# Patient Record
Sex: Male | Born: 1960 | Race: White | Hispanic: No | Marital: Single | Smoking: Former smoker
Health system: Southern US, Community
[De-identification: ages and names within clinical notes are randomized; demographics above are authoritative.]

## PROBLEM LIST (undated history)

## (undated) DIAGNOSIS — I2510 Atherosclerotic heart disease of native coronary artery without angina pectoris: Secondary | ICD-10-CM

## (undated) DIAGNOSIS — F17200 Nicotine dependence, unspecified, uncomplicated: Secondary | ICD-10-CM

## (undated) DIAGNOSIS — R7303 Prediabetes: Secondary | ICD-10-CM

## (undated) DIAGNOSIS — E785 Hyperlipidemia, unspecified: Secondary | ICD-10-CM

## (undated) DIAGNOSIS — I2119 ST elevation (STEMI) myocardial infarction involving other coronary artery of inferior wall: Secondary | ICD-10-CM

## (undated) HISTORY — PX: TONSILLECTOMY: SUR1361

## (undated) HISTORY — PX: WRIST SURGERY: SHX841

---

## 2001-01-10 HISTORY — PX: WRIST SURGERY: SHX841

## 2001-10-16 ENCOUNTER — Ambulatory Visit (HOSPITAL_BASED_OUTPATIENT_CLINIC_OR_DEPARTMENT_OTHER): Admission: RE | Admit: 2001-10-16 | Discharge: 2001-10-17 | Payer: Self-pay | Admitting: Orthopedic Surgery

## 2010-06-04 ENCOUNTER — Emergency Department (HOSPITAL_COMMUNITY)
Admission: EM | Admit: 2010-06-04 | Discharge: 2010-06-04 | Disposition: A | Discharge status: Home / Self Care | Payer: 59 | Attending: Emergency Medicine | Admitting: Emergency Medicine

## 2010-06-04 DIAGNOSIS — E7800 Pure hypercholesterolemia, unspecified: Secondary | ICD-10-CM | POA: Insufficient documentation

## 2010-06-04 DIAGNOSIS — R42 Dizziness and giddiness: Secondary | ICD-10-CM | POA: Insufficient documentation

## 2010-06-04 DIAGNOSIS — R252 Cramp and spasm: Secondary | ICD-10-CM | POA: Insufficient documentation

## 2010-06-04 LAB — POCT I-STAT, CHEM 8
Calcium, Ion: 1.12 mmol/L (ref 1.12–1.32)
Creatinine, Ser: 1.1 mg/dL (ref 0.4–1.5)
Glucose, Bld: 72 mg/dL (ref 70–99)
Hemoglobin: 17 g/dL (ref 13.0–17.0)
TCO2: 26 mmol/L (ref 0–100)

## 2010-06-04 LAB — URINALYSIS, ROUTINE W REFLEX MICROSCOPIC
Bilirubin Urine: NEGATIVE
Glucose, UA: NEGATIVE mg/dL
Hgb urine dipstick: NEGATIVE
Protein, ur: NEGATIVE mg/dL
Urobilinogen, UA: 0.2 mg/dL (ref 0.0–1.0)

## 2016-10-06 ENCOUNTER — Encounter (HOSPITAL_COMMUNITY): Payer: Self-pay

## 2016-10-06 ENCOUNTER — Inpatient Hospital Stay (HOSPITAL_COMMUNITY)
Admission: EM | Admit: 2016-10-06 | Discharge: 2016-10-09 | DRG: 282 | Disposition: A | Discharge status: Home / Self Care | Payer: Commercial Managed Care - PPO | Attending: Cardiology | Admitting: Cardiology

## 2016-10-06 ENCOUNTER — Emergency Department (HOSPITAL_COMMUNITY): Payer: Commercial Managed Care - PPO

## 2016-10-06 DIAGNOSIS — I2729 Other secondary pulmonary hypertension: Secondary | ICD-10-CM

## 2016-10-06 DIAGNOSIS — F17210 Nicotine dependence, cigarettes, uncomplicated: Secondary | ICD-10-CM | POA: Diagnosis present

## 2016-10-06 DIAGNOSIS — E785 Hyperlipidemia, unspecified: Secondary | ICD-10-CM | POA: Diagnosis present

## 2016-10-06 DIAGNOSIS — Z8249 Family history of ischemic heart disease and other diseases of the circulatory system: Secondary | ICD-10-CM

## 2016-10-06 DIAGNOSIS — I2510 Atherosclerotic heart disease of native coronary artery without angina pectoris: Secondary | ICD-10-CM | POA: Diagnosis present

## 2016-10-06 DIAGNOSIS — I25110 Atherosclerotic heart disease of native coronary artery with unstable angina pectoris: Secondary | ICD-10-CM | POA: Diagnosis not present

## 2016-10-06 DIAGNOSIS — I447 Left bundle-branch block, unspecified: Secondary | ICD-10-CM | POA: Diagnosis present

## 2016-10-06 DIAGNOSIS — R079 Chest pain, unspecified: Secondary | ICD-10-CM | POA: Diagnosis present

## 2016-10-06 DIAGNOSIS — Z720 Tobacco use: Secondary | ICD-10-CM

## 2016-10-06 DIAGNOSIS — I2119 ST elevation (STEMI) myocardial infarction involving other coronary artery of inferior wall: Principal | ICD-10-CM | POA: Diagnosis present

## 2016-10-06 DIAGNOSIS — Z833 Family history of diabetes mellitus: Secondary | ICD-10-CM | POA: Diagnosis not present

## 2016-10-06 DIAGNOSIS — I259 Chronic ischemic heart disease, unspecified: Secondary | ICD-10-CM

## 2016-10-06 HISTORY — DX: ST elevation (STEMI) myocardial infarction involving other coronary artery of inferior wall: I21.19

## 2016-10-06 HISTORY — DX: Nicotine dependence, unspecified, uncomplicated: F17.200

## 2016-10-06 HISTORY — DX: Hyperlipidemia, unspecified: E78.5

## 2016-10-06 LAB — BASIC METABOLIC PANEL
Anion gap: 10 (ref 5–15)
BUN: 5 mg/dL — AB (ref 6–20)
CHLORIDE: 104 mmol/L (ref 101–111)
CO2: 21 mmol/L — ABNORMAL LOW (ref 22–32)
Calcium: 9.3 mg/dL (ref 8.9–10.3)
Creatinine, Ser: 0.92 mg/dL (ref 0.61–1.24)
GFR calc Af Amer: >60 mL/min (ref >60)
GFR calc non Af Amer: >60 mL/min (ref >60)
Glucose, Bld: 124 mg/dL — ABNORMAL HIGH (ref 65–99)
POTASSIUM: 4 mmol/L (ref 3.5–5.1)
SODIUM: 135 mmol/L (ref 135–145)

## 2016-10-06 LAB — CBC
HEMATOCRIT: 47.6 % (ref 39.0–52.0)
Hemoglobin: 16.3 g/dL (ref 13.0–17.0)
MCH: 33.5 pg (ref 26.0–34.0)
MCHC: 34.2 g/dL (ref 30.0–36.0)
MCV: 97.9 fL (ref 78.0–100.0)
Platelets: 232 10*3/uL (ref 150–400)
RBC: 4.86 MIL/uL (ref 4.22–5.81)
RDW: 12.3 % (ref 11.5–15.5)
WBC: 15.1 10*3/uL — AB (ref 4.0–10.5)

## 2016-10-06 LAB — I-STAT TROPONIN, ED: Troponin i, poc: 25.38 ng/mL (ref 0.00–0.08)

## 2016-10-06 MED ORDER — METOPROLOL TARTRATE 12.5 MG HALF TABLET
12.5000 mg | ORAL_TABLET | Freq: Two times a day (BID) | ORAL | Status: DC
  Administered 2016-10-06 – 2016-10-07 (×2): 12.5 mg via ORAL
  Filled 2016-10-06: qty 1

## 2016-10-06 MED ORDER — HEPARIN BOLUS VIA INFUSION
4000.0000 [IU] | Freq: Once | INTRAVENOUS | Status: AC
  Administered 2016-10-06: 4000 [IU] via INTRAVENOUS
  Filled 2016-10-06: qty 4000

## 2016-10-06 MED ORDER — HEPARIN (PORCINE) IN NACL 100-0.45 UNIT/ML-% IJ SOLN
1150.0000 [IU]/h | INTRAMUSCULAR | Status: DC
  Administered 2016-10-06: 1150 [IU]/h via INTRAVENOUS
  Filled 2016-10-06: qty 250

## 2016-10-06 MED ORDER — VARENICLINE TARTRATE 1 MG PO TABS
1.0000 mg | ORAL_TABLET | Freq: Two times a day (BID) | ORAL | Status: DC
  Administered 2016-10-07 – 2016-10-08 (×4): 1 mg via ORAL
  Filled 2016-10-06 (×6): qty 1

## 2016-10-06 MED ORDER — ASPIRIN 81 MG PO CHEW
324.0000 mg | CHEWABLE_TABLET | ORAL | Status: AC
  Administered 2016-10-06: 324 mg via ORAL
  Filled 2016-10-06: qty 4

## 2016-10-06 MED ORDER — NITROGLYCERIN 0.4 MG SL SUBL: 0.4000 mg | SUBLINGUAL_TABLET | SUBLINGUAL | Status: DC | PRN

## 2016-10-06 MED ORDER — ASPIRIN 300 MG RE SUPP: 300.0000 mg | RECTAL | Status: AC

## 2016-10-06 NOTE — ED Notes (Signed)
Dr Corlis Leak, CN Lequita Halt and NF [facility] informed Troponin 25.38

## 2016-10-06 NOTE — ED Provider Notes (Signed)
[facility]-EMERGENCY DEPT Provider Note   CSN: [id number] Arrival date & time: 10/06/16  1914     History   Chief Complaint Chief Complaint  Patient presents with  . Chest Pain    HPI Raymond Bowers is a 56 y.o. male.  HPI   Pt is a 56 yo presenting with chest pain.  Patient has history of high cholesterol and family history of CAD. Patient reports that at 1 AM he woke with stabbing chest pain. He felt stabbing between his shoulder blades. He reports having hard time feeling energized, global fatigue since then.heaviness to bilateral arms.   History reviewed. No pertinent past medical history.  There are no active problems to display for this patient.   History reviewed. No pertinent surgical history.     Home Medications    Prior to Admission medications   Not on File    Family History No family history on file.  Social History Social History  Substance Use Topics  . Smoking status: Current Some Day Smoker  . Smokeless tobacco: Never Used  . Alcohol use No     Allergies   Patient has no known allergies.   Review of Systems Review of Systems  Constitutional: Negative for activity change.  Respiratory: Positive for chest tightness. Negative for shortness of breath.   Cardiovascular: Positive for chest pain.  Gastrointestinal: Negative for abdominal pain.     Physical Exam Updated Vital Signs BP (!) 158/91   Pulse 74   Temp 98 F (36.7 C)   Resp 15   SpO2 98%   Physical Exam  Constitutional: He is oriented to person, place, and time. He appears well-nourished.  HENT:  Head: Normocephalic.  Eyes: Conjunctivae are normal.  Cardiovascular: Normal rate and regular rhythm.   Pulmonary/Chest: Effort normal and breath sounds normal.  Abdominal: Soft. He exhibits no distension. There is no tenderness.  Neurological: He is oriented to person, place, and time.  Skin: Skin is warm and dry. He is not diaphoretic.  Psychiatric: He has a normal mood and  affect. His behavior is normal.     ED Treatments / Results  Labs (all labs ordered are listed, but only abnormal results are displayed) Labs Reviewed  BASIC METABOLIC PANEL - Abnormal; Notable for the following:       Result Value   CO2 21 (*)    Glucose, Bld 124 (*)    BUN 5 (*)    All other components within normal limits  CBC - Abnormal; Notable for the following:    WBC 15.1 (*)    All other components within normal limits  I-STAT TROPONIN, ED - Abnormal; Notable for the following:    Troponin i, poc 25.38 (*)    All other components within normal limits    EKG  EKG Interpretation  Date/Time:  Thursday October 06 2016 19:32:08 EDT Ventricular Rate:  71 PR Interval:  126 QRS Duration: 94 QT Interval:  394 QTC Calculation: 428 R Axis:   -15 Text Interpretation:  Sinus rhythm with sinus arrhythmia with frequent Premature ventricular complexes Inferior infarct , age undetermined Possible Anterior infarct , age undetermined Abnormal ECG bundle at 3, avf, not dynamically changing, pt has no CP.  Confirmed by Bary Castilla ([id number]) on 10/06/2016 8:13:53 PM       Radiology No results found.  Procedures Procedures (including critical care time) CRITICAL CARE Performed by: Arlana Hove Total critical care time: 60 minutes Critical care time was exclusive of separately billable procedures  and treating other patients. Critical care was necessary to treat or prevent imminent or life-threatening deterioration. Critical care was time spent personally by me on the following activities: development of treatment plan with patient and/or surrogate as well as nursing, discussions with consultants, evaluation of patient's response to treatment, examination of patient, obtaining history from patient or surrogate, ordering and performing treatments and interventions, ordering and review of laboratory studies, ordering and review of radiographic studies, pulse oximetry and  re-evaluation of patient's condition.  Medications Ordered in ED Medications - No data to display   Initial Impression / Assessment and Plan / ED Course  I have reviewed the triage vital signs and the nursing notes.  Pertinent labs & imaging results that were available during my care of the patient were reviewed by me and considered in my medical decision making (see chart for details).     Very well-appearing 56 year old male presenting with chest pain that started last night at 1 AM. He now only feels a mild indigestion. Patient's troponin is 25. EKG shows narrow complex with junctional beats with left bundle branch block.  8:45 PM Called Dr. Allyson Sabal on for STEMI pager to discuss EKG findings. He recommends admission to cardiology with heparin.    CRITICAL CARE Performed by: Arlana Hove Total critical care time: 60 minutes Critical care time was exclusive of separately billable procedures and treating other patients. Critical care was necessary to treat or prevent imminent or life-threatening deterioration. Critical care was time spent personally by me on the following activities: development of treatment plan with patient and/or surrogate as well as nursing, discussions with consultants, evaluation of patient's response to treatment, examination of patient, obtaining history from patient or surrogate, ordering and performing treatments and interventions, ordering and review of laboratory studies, ordering and review of radiographic studies, pulse oximetry and re-evaluation of patient's condition.  Final Clinical Impressions(s) / ED Diagnoses   Final diagnoses:  None    New Prescriptions New Prescriptions   No medications on file     Raymond Derrick, MD 10/10/16 [phone]

## 2016-10-06 NOTE — ED Notes (Signed)
Cardiologist at bedside.  

## 2016-10-06 NOTE — H&P (Signed)
CARDIOLOGY ADMISSION NOTE  Patient ID: Raymond Bowers MRN: [id number] DOB/AGE: 06-27-1960 56 y.o.  Admit date: 10/06/2016 Primary Physician   Dr. Jeannetta Nap Primary Cardiologist   New  Chief Complaint    Chest pain  HPI:   The patient has no past cardiac history.  He reports that at 1 am he developed chest pain.  This was severe, mid upper pain.  He has not had this before.  He thought that it was reflux.  It radiated to his back and shoulders.  His arms hurt.  He walked around his house and took ASA.  The pain eased about two hours later.  He had some residual arm and chest soreness but was able to go to work.  He felt like he had some residual soreness so when he got home from work he came to the ED.  He says that he is currently pain free.  His initial troponin was greater than 25.  EKG demonstrates inferior Q waves with mild 1 mm ST elevation lead II with inverting T waves.  He has intermittent LBBB.    The patient otherwise as been well.  The patient denies any new symptoms such as chest discomfort, neck or arm discomfort. There has been no new shortness of breath, PND or orthopnea. There have been no reported palpitations, presyncope or syncope.  He is active at home.    Past Medical History:  Diagnosis Date  . Dyslipidemia     Past Surgical History:  Procedure Laterality Date  . TONSILLECTOMY    . WRIST SURGERY      No Known Allergies No current facility-administered medications on file prior to encounter.    No current outpatient prescriptions on file prior to encounter.   Social History   Social History  . Marital status: Single    Spouse name: N/A  . Number of children: 1  . Years of education: N/A   Occupational History  . Not on file.   Social History Main Topics  . Smoking status: Current Some Day Smoker    Packs/day: 1.00    Years: 25.00    Types: Cigarettes  . Smokeless tobacco: Never Used  . Alcohol use No  . Drug use: Unknown  . Sexual activity: Not on  file   Other Topics Concern  . Not on file   Social History Narrative   Lives at home with son.  Geophysical data processor.      Family History  Problem Relation Age of Onset  . Heart attack Mother 55  . Peripheral vascular disease Brother   . Diabetes Brother      ROS:  As stated in the HPI and negative for all other systems.  Physical Exam: Blood pressure (!) 125/93, pulse 75, temperature 98 F (36.7 C), resp. rate 16, height  (1.778 m), weight 190 lb (86.2 kg), SpO2 98 %.  GENERAL:  Well appearing HEENT:  Pupils equal round and reactive, fundi not visualized, oral mucosa unremarkable NECK:  No jugular venous distention, waveform within normal limits, carotid upstroke brisk and symmetric, no bruits, no thyromegaly LYMPHATICS:  No cervical, inguinal adenopathy LUNGS:  Clear to auscultation bilaterally BACK:  No CVA tenderness CHEST:  Unremarkable HEART:  PMI not displaced or sustained,S1 and S2 within normal limits, no S3, no S4, no clicks, no rubs, no murmurs ABD:  Flat, positive bowel sounds normal in frequency in pitch, no bruits, no rebound, no guarding, no midline pulsatile mass, no hepatomegaly, no splenomegaly EXT:  2 plus pulses throughout, no edema, no cyanosis no clubbing SKIN:  No rashes no nodules NEURO:  Cranial nerves II through XII grossly intact, motor grossly intact throughout PSYCH:  Cognitively intact, oriented to person place and time  Labs: Lab Results  Component Value Date   BUN 5 (L) 10/06/2016   Lab Results  Component Value Date   CREATININE 0.92 10/06/2016   Lab Results  Component Value Date   NA 135 10/06/2016   K 4.0 10/06/2016   CL 104 10/06/2016   CO2 21 (L) 10/06/2016   No results found for: TROPONINI Lab Results  Component Value Date   WBC 15.1 (H) 10/06/2016   HGB 16.3 10/06/2016   HCT 47.6 10/06/2016   MCV 97.9 10/06/2016   PLT 232 10/06/2016   No results found for: CHOL, HDL, LDLCALC, LDLDIRECT, TRIG, CHOLHDL No results  found for: ALT, AST, GGT, ALKPHOS, BILITOT    Radiology:  CXR:  Heart size within normal limits. No acute consolidation or effusion. Possible small calcified nodules in the right upper lobe. No pneumothorax.  EKG:  NSR, rate 70, axis WNL, intervals WNL, inferior ST elevation of 1mm lead II and III.  Inferior Q waves with mild inferior T wave inversion. Intermittent LBBB.    ASSESSMENT AND PLAN:    INFERIOR MI:  Late presentation of an MI (20 hours) without ongoing pain.   No indication for urgent cath at this point.  Admit for heparin, ASA, beta blocker.  Cath in AM.  The patient understands that risks included but are not limited to stroke (1 in 1000), death (1 in 1000), kidney failure [usually temporary] (1 in 500), bleeding (1 in 200), allergic reaction [possibly serious] (1 in 200).  The patient understands and agrees to proceed.   TOBACCO:  Continue Chantix  DYSLIPIDEMIA:  Increase Lipitor to 80 mg daily.    SignedRollene Rotunda 10/06/2016, 9:43 PM

## 2016-10-06 NOTE — ED Triage Notes (Signed)
Pt states that he woke up last night with CP around 1am, central, radiation to both arm and shoulders, denies other cardiac symptoms, pain has subsided but he still feels a burning like indigestion.

## 2016-10-06 NOTE — ED Notes (Signed)
[name] would like to be contacted when moved to a room, number in the chart

## 2016-10-06 NOTE — ED Notes (Signed)
Pain lasted about an hour and a half. Pain started this morning around 1am, sharp central chest pain that woke him from his sleep.

## 2016-10-06 NOTE — ED Notes (Signed)
Pt provided with turkey sandwich and diet coke 

## 2016-10-06 NOTE — Progress Notes (Signed)
ANTICOAGULATION CONSULT NOTE - Initial Consult  Pharmacy Consult for heparin Indication: chest pain/ACS  No Known Allergies  Patient Measurements: Height:  (177.8 cm) Weight: 190 lb (86.2 kg) IBW/kg (Calculated) : 73 Heparin Dosing Weight: 86.2 kg  Vital Signs: Temp: 98 F (36.7 C) (09/27 1928) BP: 158/91 (09/27 2015) Pulse Rate: 74 (09/27 2015)  Labs:  Recent Labs  10/06/16 1928  HGB 16.3  HCT 47.6  PLT 232  CREATININE 0.92     Medical History: History reviewed. No pertinent past medical history.   Assessment: 56 yo male with chest pain with radiation to both arms and shoulders. Troponin 25. Starting heparin gtt for NSTEMI. CBC wnl.   Goal of Therapy:  Heparin level 0.3-0.7 units/ml Monitor platelets by anticoagulation protocol: Yes    Plan:  -Heparin bolus 4000 units x1 then 1150 units/hr -Daily HL, CBC -First level with AM labs -F/u cards plans   Baldemar Friday 10/06/2016,8:51 PM

## 2016-10-07 ENCOUNTER — Inpatient Hospital Stay (HOSPITAL_COMMUNITY)
Admission: EM | Disposition: A | Discharge status: Home / Self Care | Payer: Self-pay | Source: Home / Self Care | Attending: Cardiology

## 2016-10-07 ENCOUNTER — Encounter (HOSPITAL_COMMUNITY): Payer: Self-pay

## 2016-10-07 DIAGNOSIS — Z720 Tobacco use: Secondary | ICD-10-CM

## 2016-10-07 DIAGNOSIS — I2729 Other secondary pulmonary hypertension: Secondary | ICD-10-CM

## 2016-10-07 DIAGNOSIS — E785 Hyperlipidemia, unspecified: Secondary | ICD-10-CM

## 2016-10-07 DIAGNOSIS — I2119 ST elevation (STEMI) myocardial infarction involving other coronary artery of inferior wall: Secondary | ICD-10-CM

## 2016-10-07 DIAGNOSIS — I25110 Atherosclerotic heart disease of native coronary artery with unstable angina pectoris: Secondary | ICD-10-CM

## 2016-10-07 HISTORY — PX: ULTRASOUND GUIDANCE FOR VASCULAR ACCESS: SHX6516

## 2016-10-07 HISTORY — PX: LEFT HEART CATH AND CORONARY ANGIOGRAPHY: CATH118249

## 2016-10-07 HISTORY — DX: ST elevation (STEMI) myocardial infarction involving other coronary artery of inferior wall: I21.19

## 2016-10-07 LAB — BASIC METABOLIC PANEL
Anion gap: 7 (ref 5–15)
BUN: 6 mg/dL (ref 6–20)
CHLORIDE: 104 mmol/L (ref 101–111)
CO2: 22 mmol/L (ref 22–32)
CREATININE: 0.81 mg/dL (ref 0.61–1.24)
Calcium: 8.5 mg/dL — ABNORMAL LOW (ref 8.9–10.3)
GFR calc Af Amer: >60 mL/min (ref >60)
GFR calc non Af Amer: >60 mL/min (ref >60)
GLUCOSE: 125 mg/dL — AB (ref 65–99)
POTASSIUM: 3.9 mmol/L (ref 3.5–5.1)
SODIUM: 133 mmol/L — AB (ref 135–145)

## 2016-10-07 LAB — LIPID PANEL
CHOL/HDL RATIO: 3 ratio
Cholesterol: 151 mg/dL (ref 0–200)
HDL: 50 mg/dL (ref >40)
LDL CALC: 85 mg/dL (ref 0–99)
TRIGLYCERIDES: 81 mg/dL (ref <150)
VLDL: 16 mg/dL (ref 0–40)

## 2016-10-07 LAB — PROTIME-INR
INR: 1.18
PROTHROMBIN TIME: 15 s (ref 11.4–15.2)

## 2016-10-07 LAB — CBC
HCT: 43.8 % (ref 39.0–52.0)
HCT: 45 % (ref 39.0–52.0)
HEMOGLOBIN: 15 g/dL (ref 13.0–17.0)
Hemoglobin: 15.7 g/dL (ref 13.0–17.0)
MCH: 33.9 pg (ref 26.0–34.0)
MCH: 34.4 pg — ABNORMAL HIGH (ref 26.0–34.0)
MCHC: 34.2 g/dL (ref 30.0–36.0)
MCHC: 34.9 g/dL (ref 30.0–36.0)
MCV: 98.9 fL (ref 78.0–100.0)
MCV: 98.7 fL (ref 78.0–100.0)
PLATELETS: 167 10*3/uL (ref 150–400)
Platelets: 186 10*3/uL (ref 150–400)
RBC: 4.43 MIL/uL (ref 4.22–5.81)
RBC: 4.56 MIL/uL (ref 4.22–5.81)
RDW: 12.4 % (ref 11.5–15.5)
RDW: 12.6 % (ref 11.5–15.5)
WBC: 12.2 10*3/uL — ABNORMAL HIGH (ref 4.0–10.5)
WBC: 13.5 10*3/uL — AB (ref 4.0–10.5)

## 2016-10-07 LAB — HEPARIN LEVEL (UNFRACTIONATED): HEPARIN UNFRACTIONATED: 0.39 [IU]/mL (ref 0.30–0.70)

## 2016-10-07 LAB — CREATININE, SERUM: CREATININE: 0.87 mg/dL (ref 0.61–1.24)

## 2016-10-07 LAB — TROPONIN I
TROPONIN I: 24.45 ng/mL — AB (ref <0.03)
Troponin I: 36.33 ng/mL (ref <0.03)
Troponin I: 30.95 ng/mL (ref <0.03)

## 2016-10-07 LAB — HIV ANTIBODY (ROUTINE TESTING W REFLEX): HIV Screen 4th Generation wRfx: NONREACTIVE

## 2016-10-07 LAB — TSH: TSH: 1.18 u[IU]/mL (ref 0.350–4.500)

## 2016-10-07 SURGERY — LEFT HEART CATH AND CORONARY ANGIOGRAPHY
Anesthesia: LOCAL

## 2016-10-07 MED ORDER — CLOPIDOGREL BISULFATE 75 MG PO TABS
75.0000 mg | ORAL_TABLET | Freq: Every day | ORAL | Status: DC
  Administered 2016-10-08: 75 mg via ORAL
  Filled 2016-10-07 (×2): qty 1

## 2016-10-07 MED ORDER — SODIUM CHLORIDE 0.9 % WEIGHT BASED INFUSION: 1.0000 mL/kg/h | INTRAVENOUS | Status: DC

## 2016-10-07 MED ORDER — ACETAMINOPHEN 325 MG PO TABS: 650.0000 mg | ORAL_TABLET | ORAL | Status: DC | PRN

## 2016-10-07 MED ORDER — ONDANSETRON HCL 4 MG/2ML IJ SOLN: 4.0000 mg | Freq: Four times a day (QID) | INTRAMUSCULAR | Status: DC | PRN

## 2016-10-07 MED ORDER — ASPIRIN EC 81 MG PO TBEC
81.0000 mg | DELAYED_RELEASE_TABLET | Freq: Every day | ORAL | Status: DC
  Administered 2016-10-07: 81 mg via ORAL
  Filled 2016-10-07: qty 1

## 2016-10-07 MED ORDER — SODIUM CHLORIDE 0.9 % IV SOLN: 250.0000 mL | INTRAVENOUS | Status: DC | PRN

## 2016-10-07 MED ORDER — VERAPAMIL HCL 2.5 MG/ML IV SOLN
INTRAVENOUS | Status: AC
  Filled 2016-10-07: qty 2

## 2016-10-07 MED ORDER — SODIUM CHLORIDE 0.9 % WEIGHT BASED INFUSION
3.0000 mL/kg/h | INTRAVENOUS | Status: DC
  Administered 2016-10-07: 3 mL/kg/h via INTRAVENOUS

## 2016-10-07 MED ORDER — HEPARIN SODIUM (PORCINE) 5000 UNIT/ML IJ SOLN
5000.0000 [IU] | Freq: Three times a day (TID) | INTRAMUSCULAR | Status: DC
  Administered 2016-10-07 – 2016-10-09 (×5): 5000 [IU] via SUBCUTANEOUS
  Filled 2016-10-07 (×5): qty 1

## 2016-10-07 MED ORDER — MIDAZOLAM HCL 2 MG/2ML IJ SOLN
INTRAMUSCULAR | Status: AC
  Filled 2016-10-07: qty 2

## 2016-10-07 MED ORDER — ASPIRIN 81 MG PO CHEW
81.0000 mg | CHEWABLE_TABLET | ORAL | Status: AC
  Administered 2016-10-07: 81 mg via ORAL
  Filled 2016-10-07: qty 1

## 2016-10-07 MED ORDER — HEPARIN (PORCINE) IN NACL 2-0.9 UNIT/ML-% IJ SOLN
INTRAMUSCULAR | Status: AC
  Filled 2016-10-07: qty 1000

## 2016-10-07 MED ORDER — SODIUM CHLORIDE 0.9% FLUSH
3.0000 mL | Freq: Two times a day (BID) | INTRAVENOUS | Status: DC
  Administered 2016-10-07 – 2016-10-08 (×3): 3 mL via INTRAVENOUS

## 2016-10-07 MED ORDER — ATORVASTATIN CALCIUM 80 MG PO TABS: 80.0000 mg | ORAL_TABLET | Freq: Every day | ORAL | Status: DC

## 2016-10-07 MED ORDER — SODIUM CHLORIDE 0.9% FLUSH: 3.0000 mL | INTRAVENOUS | Status: DC | PRN

## 2016-10-07 MED ORDER — OXYCODONE HCL 5 MG PO TABS: 5.0000 mg | ORAL_TABLET | ORAL | Status: DC | PRN

## 2016-10-07 MED ORDER — HEPARIN (PORCINE) IN NACL 2-0.9 UNIT/ML-% IJ SOLN
INTRAMUSCULAR | Status: AC | PRN
  Administered 2016-10-07: 1000 mL

## 2016-10-07 MED ORDER — SODIUM CHLORIDE 0.9% FLUSH: 3.0000 mL | Freq: Two times a day (BID) | INTRAVENOUS | Status: DC

## 2016-10-07 MED ORDER — SODIUM CHLORIDE 0.9 % IV SOLN
INTRAVENOUS | Status: AC
  Administered 2016-10-07: 12:00:00 via INTRAVENOUS

## 2016-10-07 MED ORDER — IOPAMIDOL (ISOVUE-370) INJECTION 76%
INTRAVENOUS | Status: DC | PRN
  Administered 2016-10-07: 110 mL via INTRAVENOUS

## 2016-10-07 MED ORDER — FENTANYL CITRATE (PF) 100 MCG/2ML IJ SOLN
INTRAMUSCULAR | Status: DC | PRN
  Administered 2016-10-07: 50 ug via INTRAVENOUS

## 2016-10-07 MED ORDER — IOPAMIDOL (ISOVUE-370) INJECTION 76%
INTRAVENOUS | Status: AC
  Filled 2016-10-07: qty 100

## 2016-10-07 MED ORDER — MIDAZOLAM HCL 2 MG/2ML IJ SOLN
INTRAMUSCULAR | Status: DC | PRN
  Administered 2016-10-07 (×2): 1 mg via INTRAVENOUS

## 2016-10-07 MED ORDER — ATORVASTATIN CALCIUM 80 MG PO TABS
80.0000 mg | ORAL_TABLET | Freq: Every day | ORAL | Status: DC
  Administered 2016-10-07 – 2016-10-08 (×2): 80 mg via ORAL
  Filled 2016-10-07 (×2): qty 1

## 2016-10-07 MED ORDER — HEPARIN SODIUM (PORCINE) 1000 UNIT/ML IJ SOLN
INTRAMUSCULAR | Status: AC
  Filled 2016-10-07: qty 1

## 2016-10-07 MED ORDER — HEPARIN SODIUM (PORCINE) 1000 UNIT/ML IJ SOLN
INTRAMUSCULAR | Status: DC | PRN
  Administered 2016-10-07: 5000 [IU] via INTRAVENOUS

## 2016-10-07 MED ORDER — VERAPAMIL HCL 2.5 MG/ML IV SOLN
INTRAVENOUS | Status: DC | PRN
  Administered 2016-10-07: 10 mL via INTRA_ARTERIAL

## 2016-10-07 MED ORDER — LIDOCAINE HCL (PF) 1 % IJ SOLN
INTRAMUSCULAR | Status: DC | PRN
  Administered 2016-10-07: 2 mL

## 2016-10-07 MED ORDER — LIDOCAINE HCL 2 % IJ SOLN
INTRAMUSCULAR | Status: AC
  Filled 2016-10-07: qty 10

## 2016-10-07 MED ORDER — ASPIRIN 81 MG PO CHEW
81.0000 mg | CHEWABLE_TABLET | Freq: Every day | ORAL | Status: DC
  Administered 2016-10-08: 81 mg via ORAL
  Filled 2016-10-07 (×2): qty 1

## 2016-10-07 MED ORDER — METOPROLOL TARTRATE 25 MG PO TABS
25.0000 mg | ORAL_TABLET | Freq: Two times a day (BID) | ORAL | Status: DC
  Administered 2016-10-07 – 2016-10-08 (×3): 25 mg via ORAL
  Filled 2016-10-07 (×3): qty 1

## 2016-10-07 MED ORDER — FENTANYL CITRATE (PF) 100 MCG/2ML IJ SOLN
INTRAMUSCULAR | Status: AC
  Filled 2016-10-07: qty 2

## 2016-10-07 SURGICAL SUPPLY — 17 items
CATH 5FR JL3.5 JR4 ANG PIG MP (CATHETERS) ×3 IMPLANT
CATH LAUNCHER 5F EBU3.0 (CATHETERS) ×2 IMPLANT
CATHETER LAUNCHER 5F EBU3.0 (CATHETERS) ×3
COVER PRB 48X5XTLSCP FOLD TPE (BAG) ×2 IMPLANT
COVER PROBE 5X48 (BAG) ×1
DEVICE RAD COMP TR BAND LRG (VASCULAR PRODUCTS) ×3 IMPLANT
GLIDESHEATH SLEND A-KIT 6F 22G (SHEATH) ×3 IMPLANT
GUIDEWIRE INQWIRE 1.5J.035X260 (WIRE) ×2 IMPLANT
INQWIRE 1.5J .035X260CM (WIRE) ×3
KIT HEART LEFT (KITS) ×3 IMPLANT
PACK CARDIAC CATHETERIZATION (CUSTOM PROCEDURE TRAY) ×3 IMPLANT
PROTECTION STATION PRESSURIZED (MISCELLANEOUS) ×3
STATION PROTECTION PRESSURIZED (MISCELLANEOUS) ×2 IMPLANT
TRANSDUCER W/STOPCOCK (MISCELLANEOUS) ×3 IMPLANT
TUBING ART PRESS 72  MALE/FEM (TUBING) ×1
TUBING ART PRESS 72 MALE/FEM (TUBING) ×2 IMPLANT
TUBING CIL FLEX 10 FLL-RA (TUBING) ×3 IMPLANT

## 2016-10-07 NOTE — Progress Notes (Signed)
Progress Note  Patient Name: Raymond Bowers Date of Encounter: 10/07/2016  Primary Cardiologist: Hochrein  Subjective   No current chest discomfort. He presented with late inferior infarct. Has hyperlipidemia, smokes and  family history of CAD.  Inpatient Medications    Scheduled Meds: . aspirin EC  81 mg Oral Daily  . atorvastatin  80 mg Oral q1800  . metoprolol tartrate  12.5 mg Oral BID  . sodium chloride flush  3 mL Intravenous Q12H  . varenicline  1 mg Oral BID WC   Continuous Infusions: . sodium chloride    . sodium chloride 1 mL/kg/hr (10/07/16 0726)  . heparin 1,150 Units/hr (10/06/16 2111)   PRN Meds: sodium chloride, acetaminophen, nitroGLYCERIN, ondansetron (ZOFRAN) IV, sodium chloride flush   Vital Signs    Vitals:   10/06/16 2300 10/07/16 0050 10/07/16 0415 10/07/16 0830  BP: (!) 153/95 (!) 150/92 130/80 128/82  Pulse: 65 (!) 58  70  Resp: 13 16 (!) 24 20  Temp:  99.3 F (37.4 C) 99.1 F (37.3 C) 98.9 F (37.2 C)  TempSrc:  Oral Oral Oral  SpO2: 97% 96%  98%  Weight:  186 lb 12.8 oz (84.7 kg)    Height:   (1.778 m)      Intake/Output Summary (Last 24 hours) at 10/07/16 0911 Last data filed at 10/07/16 0727  Gross per 24 hour  Intake           557.43 ml  Output              250 ml  Net           307.43 ml   Filed Weights   10/06/16 2015 10/07/16 0050  Weight: 190 lb (86.2 kg) 186 lb 12.8 oz (84.7 kg)    Telemetry    Sinus rhythm with occasional PVCs. - Personally Reviewed  ECG    Sinus rhythm with inferior infarct, recent - Personally Reviewed  Physical Exam  Young appearing 56 year old GEN: No acute distress.   Neck: No JVD Cardiac: RRR, no murmurs, rubs, or gallops.  Respiratory: Clear to auscultation bilaterally. GI: Soft, nontender, non-distended  MS: No edema; No deformity. Neuro:  Nonfocal  Psych: Normal affect   Labs    Chemistry Recent Labs Lab 10/06/16 1928 10/07/16 0800  NA 135 133*  K 4.0 3.9  CL 104 104   CO2 21* 22  GLUCOSE 124* 125*  BUN 5* 6  CREATININE 0.92 0.81  CALCIUM 9.3 8.5*  GFRNONAA >60 >60  GFRAA >60 >60  ANIONGAP 10 7     Hematology Recent Labs Lab 10/06/16 1928 10/07/16 0800  WBC 15.1* 12.2*  RBC 4.86 4.43  HGB 16.3 15.0  HCT 47.6 43.8  MCV 97.9 98.9  MCH 33.5 33.9  MCHC 34.2 34.2  RDW 12.3 12.4  PLT 232 186    Cardiac Enzymes Recent Labs Lab 10/07/16 0112 10/07/16 0800  TROPONINI 36.33* 30.95*    Recent Labs Lab 10/06/16 1948  TROPIPOC 25.38*     BNPNo results for input(s): BNP, PROBNP in the last 168 hours.   DDimer No results for input(s): DDIMER in the last 168 hours.   Radiology    Dg Chest Portable 1 View  Result Date: 10/06/2016 CLINICAL DATA:  Chest pain EXAM: PORTABLE CHEST 1 VIEW COMPARISON:  None. FINDINGS: Heart size within normal limits. No acute consolidation or effusion. Possible small calcified nodules in the right upper lobe. No pneumothorax. IMPRESSION: No active disease. Electronically Signed  By: Jasmine Pang M.D.   On: 10/06/2016 20:47    Cardiac Studies   No vascular studies  Patient Profile     56 y.o. male with multiple risk factors including smoking, hyperlipidemia on statin therapy, and family history. The patient presents 18-24 hours after onset of symptoms and has evidence of Q-wave inferior infarct on EKG.  Assessment & Plan    1. Probable completed inferior infarction starting 18-24 hours prior to presentation. Significant elevation in markers and Q waves noted on EKG. 2. Hyperlipidemia, target needs to be LDL less than 70 3. Tobacco abuse, discuss cessation.  The patient was counseled to undergo left heart catheterization, coronary angiography, and possible percutaneous coronary intervention with stent implantation. The procedural risks and benefits were discussed in detail. The risks discussed included death, stroke, myocardial infarction, life-threatening bleeding, limb ischemia, kidney injury, allergy,  and possible emergency cardiac surgery. The risk of these significant complications were estimated to occur less than 1% of the time. After discussion, the patient has agreed to proceed.  For questions or updates, please contact [facility] Please consult [email or web address] for contact info under Cardiology/STEMI.      Signed, Lesleigh Noe, MD  10/07/2016, 9:11 AM

## 2016-10-07 NOTE — H&P (View-Only) (Signed)
 Progress Note  Patient Name: Raymond Bowers Date of Encounter: 10/07/2016  Primary Cardiologist: Hochrein  Subjective   No current chest discomfort. He presented with late inferior infarct. Has hyperlipidemia, smokes and  family history of CAD.  Inpatient Medications    Scheduled Meds: . aspirin EC  81 mg Oral Daily  . atorvastatin  80 mg Oral q1800  . metoprolol tartrate  12.5 mg Oral BID  . sodium chloride flush  3 mL Intravenous Q12H  . varenicline  1 mg Oral BID WC   Continuous Infusions: . sodium chloride    . sodium chloride 1 mL/kg/hr (10/07/16 0726)  . heparin 1,150 Units/hr (10/06/16 2111)   PRN Meds: sodium chloride, acetaminophen, nitroGLYCERIN, ondansetron (ZOFRAN) IV, sodium chloride flush   Vital Signs    Vitals:   10/06/16 2300 10/07/16 0050 10/07/16 0415 10/07/16 0830  BP: (!) 153/95 (!) 150/92 130/80 128/82  Pulse: 65 (!) 58  70  Resp: 13 16 (!) 24 20  Temp:  99.3 F (37.4 C) 99.1 F (37.3 C) 98.9 F (37.2 C)  TempSrc:  Oral Oral Oral  SpO2: 97% 96%  98%  Weight:  186 lb 12.8 oz (84.7 kg)    Height:  5' 10" (1.778 m)      Intake/Output Summary (Last 24 hours) at 10/07/16 0911 Last data filed at 10/07/16 0727  Gross per 24 hour  Intake           557.43 ml  Output              250 ml  Net           307.43 ml   Filed Weights   10/06/16 2015 10/07/16 0050  Weight: 190 lb (86.2 kg) 186 lb 12.8 oz (84.7 kg)    Telemetry    Sinus rhythm with occasional PVCs. - Personally Reviewed  ECG    Sinus rhythm with inferior infarct, recent - Personally Reviewed  Physical Exam  Young appearing 56-year-old GEN: No acute distress.   Neck: No JVD Cardiac: RRR, no murmurs, rubs, or gallops.  Respiratory: Clear to auscultation bilaterally. GI: Soft, nontender, non-distended  MS: No edema; No deformity. Neuro:  Nonfocal  Psych: Normal affect   Labs    Chemistry Recent Labs Lab 10/06/16 1928 10/07/16 0800  NA 135 133*  K 4.0 3.9  CL 104 104   CO2 21* 22  GLUCOSE 124* 125*  BUN 5* 6  CREATININE 0.92 0.81  CALCIUM 9.3 8.5*  GFRNONAA >60 >60  GFRAA >60 >60  ANIONGAP 10 7     Hematology Recent Labs Lab 10/06/16 1928 10/07/16 0800  WBC 15.1* 12.2*  RBC 4.86 4.43  HGB 16.3 15.0  HCT 47.6 43.8  MCV 97.9 98.9  MCH 33.5 33.9  MCHC 34.2 34.2  RDW 12.3 12.4  PLT 232 186    Cardiac Enzymes Recent Labs Lab 10/07/16 0112 10/07/16 0800  TROPONINI 36.33* 30.95*    Recent Labs Lab 10/06/16 1948  TROPIPOC 25.38*     BNPNo results for input(s): BNP, PROBNP in the last 168 hours.   DDimer No results for input(s): DDIMER in the last 168 hours.   Radiology    Dg Chest Portable 1 View  Result Date: 10/06/2016 CLINICAL DATA:  Chest pain EXAM: PORTABLE CHEST 1 VIEW COMPARISON:  None. FINDINGS: Heart size within normal limits. No acute consolidation or effusion. Possible small calcified nodules in the right upper lobe. No pneumothorax. IMPRESSION: No active disease. Electronically Signed     By: Jasmine Pang M.D.   On: 10/06/2016 20:47    Cardiac Studies   No vascular studies  Patient Profile     56 y.o. male with multiple risk factors including smoking, hyperlipidemia on statin therapy, and family history. The patient presents 18-24 hours after onset of symptoms and has evidence of Q-wave inferior infarct on EKG.  Assessment & Plan    1. Probable completed inferior infarction starting 18-24 hours prior to presentation. Significant elevation in markers and Q waves noted on EKG. 2. Hyperlipidemia, target needs to be LDL less than 70 3. Tobacco abuse, discuss cessation.  The patient was counseled to undergo left heart catheterization, coronary angiography, and possible percutaneous coronary intervention with stent implantation. The procedural risks and benefits were discussed in detail. The risks discussed included death, stroke, myocardial infarction, life-threatening bleeding, limb ischemia, kidney injury, allergy,  and possible emergency cardiac surgery. The risk of these significant complications were estimated to occur less than 1% of the time. After discussion, the patient has agreed to proceed.  For questions or updates, please contact [facility] Please consult [email or web address] for contact info under Cardiology/STEMI.      Signed, Lesleigh Noe, MD  10/07/2016, 9:11 AM

## 2016-10-07 NOTE — Progress Notes (Signed)
ANTICOAGULATION CONSULT NOTE Pharmacy Consult for heparin Indication: chest pain/ACS  No Known Allergies  Patient Measurements: Height:  (177.8 cm) Weight: 186 lb 12.8 oz (84.7 kg) IBW/kg (Calculated) : 73 Heparin Dosing Weight: 86.2 kg  Vital Signs: Temp: 99.3 F (37.4 C) (09/28 0050) Temp Source: Oral (09/28 0050) BP: 150/92 (09/28 0050) Pulse Rate: 58 (09/28 0050)  Labs:  Recent Labs  10/06/16 1928 10/07/16 0112  HGB 16.3  --   HCT 47.6  --   PLT 232  --   HEPARINUNFRC  --  0.39  CREATININE 0.92  --   TROPONINI  --  36.33*   Assessment: 56 y.o. male with NSTEMI for heparin   Goal of Therapy:  Heparin level 0.3-0.7 units/ml Monitor platelets by anticoagulation protocol: Yes    Plan:  Continue Heparin at current rate  Follow up after cath today   Raymond Bowers, Gary Fleet 10/07/2016,2:55 AM

## 2016-10-07 NOTE — Interval H&P Note (Signed)
Cath Lab Visit (complete for each Cath Lab visit)  Clinical Evaluation Leading to the Procedure:   ACS: Yes.    Non-ACS:    Anginal Classification: CCS IV  Anti-ischemic medical therapy: Minimal Therapy (1 class of medications)  Non-Invasive Test Results: No non-invasive testing performed  Prior CABG: No previous CABG      History and Physical Interval Note:  10/07/2016 10:05 AM  Raymond Bowers  has presented today for surgery, with the diagnosis of unstable angina  The various methods of treatment have been discussed with the patient and family. After consideration of risks, benefits and other options for treatment, the patient has consented to  Procedure(s): LEFT HEART CATH AND CORONARY ANGIOGRAPHY (N/A) as a surgical intervention .  The patient's history has been reviewed, patient examined, no change in status, stable for surgery.  I have reviewed the patient's chart and labs.  Questions were answered to the patient's satisfaction.     Lyn Records III

## 2016-10-08 LAB — HEMOGLOBIN A1C
Hgb A1c MFr Bld: 5.3 % (ref 4.8–5.6)
Mean Plasma Glucose: 105.41 mg/dL

## 2016-10-08 LAB — BASIC METABOLIC PANEL
ANION GAP: 7 (ref 5–15)
BUN: 6 mg/dL (ref 6–20)
CO2: 22 mmol/L (ref 22–32)
Calcium: 8.5 mg/dL — ABNORMAL LOW (ref 8.9–10.3)
Chloride: 107 mmol/L (ref 101–111)
Creatinine, Ser: 0.84 mg/dL (ref 0.61–1.24)
GFR calc Af Amer: >60 mL/min (ref >60)
GFR calc non Af Amer: >60 mL/min (ref >60)
GLUCOSE: 109 mg/dL — AB (ref 65–99)
POTASSIUM: 3.8 mmol/L (ref 3.5–5.1)
Sodium: 136 mmol/L (ref 135–145)

## 2016-10-08 NOTE — Progress Notes (Signed)
   At discharge, please arrange f/u with Dr. Catalina Gravel.

## 2016-10-08 NOTE — Progress Notes (Signed)
Subjective:  Patient with late presentation of an inferior infarction.  Catheterization yesterday showed occlusion of the right coronary artery not opened and a 70% LAD stenosis.  He is pain-free today and feels well.  No shortness of breath.  Objective:  Vital Signs in the last 24 hours: BP 99/72 (BP Location: Left Arm)   Pulse 64   Temp 98.9 F (37.2 C) (Oral)   Resp (!) 24   Ht  (1.778 m)   Wt 84.7 kg (186 lb 12.8 oz)   SpO2 94%   BMI 26.80 kg/m   Physical Exam: Pleasant male in no acute distress Lungs:  Clear Cardiac:  Regular rhythm, normal S1 and S2, no S3 Extremities:  Radial catheterization site clean and dry without hematoma or ecchymoses   Intake/Output from previous day: 09/28 0701 - 09/29 0700 In: 840 [P.O.:840] Out: 1701 [Urine:1700; Stool:1]  Weight Filed Weights   10/06/16 2015 10/07/16 0050  Weight: 86.2 kg (190 lb) 84.7 kg (186 lb 12.8 oz)    Lab Results: Basic Metabolic Panel:  Recent Labs  40/98/11 0800 10/07/16 1226 10/08/16 0405  NA 133*  --  136  K 3.9  --  3.8  CL 104  --  107  CO2 22  --  22  GLUCOSE 125*  --  109*  BUN 6  --  6  CREATININE 0.81 0.87 0.84   CBC:  Recent Labs  10/07/16 0800 10/07/16 1226  WBC 12.2* 13.5*  HGB 15.0 15.7  HCT 43.8 45.0  MCV 98.9 98.7  PLT 186 167   Cardiac Enzymes: Troponin (Point of Care Test)  Recent Labs  10/06/16 1948  TROPIPOC 25.38*   Cardiac Panel (last 3 results)  Recent Labs  10/07/16 0112 10/07/16 0800 10/07/16 1226  TROPONINI 36.33* 30.95* 24.45*    Telemetry: Personally reviewed.  Sinus rhythm  Assessment/Plan:  1.  Coronary artery disease with late presentation of an acute inferior infarction total occlusion of the right coronary artery moderately severe LAD stenosis 2.  Hyperlipidemia 3.  History of tobacco abuse  Recommendations:  Doing well following late presentation of an inferior infarction treated medically.  Continue current therapy and await  cardiac rehabilitation.  Potential discharge in the morning.     Darden Palmer  MD [facility]  10/08/2016, 11:23 AM

## 2016-10-09 ENCOUNTER — Encounter (HOSPITAL_COMMUNITY): Payer: Self-pay | Admitting: Physician Assistant

## 2016-10-09 MED ORDER — CLOPIDOGREL BISULFATE 75 MG PO TABS
75.0000 mg | ORAL_TABLET | Freq: Every day | ORAL | 11 refills | Status: DC
Start: 2016-10-09 — End: 2017-10-03

## 2016-10-09 MED ORDER — METOPROLOL SUCCINATE ER 25 MG PO TB24: 25.0000 mg | ORAL_TABLET | Freq: Every day | ORAL | 11 refills | Status: DC

## 2016-10-09 MED ORDER — ATORVASTATIN CALCIUM 80 MG PO TABS: 80.0000 mg | ORAL_TABLET | Freq: Every day | ORAL | 11 refills | Status: DC

## 2016-10-09 MED ORDER — NITROGLYCERIN 0.4 MG SL SUBL: 0.4000 mg | SUBLINGUAL_TABLET | SUBLINGUAL | 1 refills | Status: DC | PRN

## 2016-10-09 MED ORDER — METOPROLOL SUCCINATE ER 25 MG PO TB24
25.0000 mg | ORAL_TABLET | Freq: Every day | ORAL | Status: DC
  Filled 2016-10-09: qty 1

## 2016-10-09 NOTE — Progress Notes (Signed)
Discharged to home with family office visits in place teaching done  

## 2016-10-09 NOTE — Progress Notes (Signed)
Pt walked 1500 ft  tolerated well ( no o2, no walker,no pain)

## 2016-10-09 NOTE — Progress Notes (Signed)
Progress Note  Patient Name: Raymond Bowers Date of Encounter: 10/09/2016  Primary Cardiologist: Dr. Antoine Poche, Dr. Eldridge Dace  Subjective   No complaints of chest pain or shortness of breath.  Has not walked in the hall yet.  Inpatient Medications    Scheduled Meds: . aspirin  81 mg Oral Daily  . atorvastatin  80 mg Oral q1800  . clopidogrel  75 mg Oral Q breakfast  . heparin  5,000 Units Subcutaneous Q8H  . metoprolol tartrate  25 mg Oral BID  . sodium chloride flush  3 mL Intravenous Q12H  . varenicline  1 mg Oral BID WC   Continuous Infusions: . sodium chloride     PRN Meds: sodium chloride, acetaminophen, nitroGLYCERIN, ondansetron (ZOFRAN) IV, oxyCODONE, sodium chloride flush   Vital Signs    Vitals:   10/08/16 2108 10/09/16 0000 10/09/16 0008 10/09/16 0603  BP: 109/77 94/67  101/65  Pulse: 79 63  73  Resp:  (!) 26  (!) 22  Temp:   98.7 F (37.1 C) 98.8 F (37.1 C)  TempSrc:   Oral Oral  SpO2:   95% 97%  Weight:      Height:        Intake/Output Summary (Last 24 hours) at 10/09/16 0714 Last data filed at 10/08/16 2100  Gross per 24 hour  Intake              920 ml  Output                0 ml  Net              920 ml   Filed Weights   10/06/16 2015 10/07/16 0050  Weight: 190 lb (86.2 kg) 186 lb 12.8 oz (84.7 kg)     Physical Exam   General: Well developed, well nourished, male appearing in no acute distress. Head: Normocephalic, atraumatic.  Lungs:  Resp regular and unlabored, CTA. Heart: RRR, S1, S2, no S3, S4, or murmur; no rub. Extremities:Radial cath site clean and dry Neuro: Alert and oriented X 3. Moves all extremities spontaneously. Psych: Normal affect.  Labs    Chemistry Recent Labs Lab 10/06/16 1928 10/07/16 0800 10/07/16 1226 10/08/16 0405  NA 135 133*  --  136  K 4.0 3.9  --  3.8  CL 104 104  --  107  CO2 21* 22  --  22  GLUCOSE 124* 125*  --  109*  BUN 5* 6  --  6  CREATININE 0.92 0.81 0.87 0.84  CALCIUM 9.3 8.5*  --   8.5*  GFRNONAA >60 >60 >60 >60  GFRAA >60 >60 >60 >60  ANIONGAP 10 7  --  7     Hematology Recent Labs Lab 10/06/16 1928 10/07/16 0800 10/07/16 1226  WBC 15.1* 12.2* 13.5*  RBC 4.86 4.43 4.56  HGB 16.3 15.0 15.7  HCT 47.6 43.8 45.0  MCV 97.9 98.9 98.7  MCH 33.5 33.9 34.4*  MCHC 34.2 34.2 34.9  RDW 12.3 12.4 12.6  PLT 232 186 167    Cardiac Enzymes Recent Labs Lab 10/07/16 0112 10/07/16 0800 10/07/16 1226  TROPONINI 36.33* 30.95* 24.45*    Recent Labs Lab 10/06/16 1948  TROPIPOC 25.38*     Telemetry    Sinus rhythm, no arrhythmias - Personally Reviewed  ECG    No new tracings - Personally Reviewed   Cardiac Studies   Left heart cath 10/07/16:  Total occlusion of the mid right coronary without significant collaterals. TIMI  grade 1 antegrade flow. Patient is asymptomatic. Intervention is not indicated.  Mid eccentric 70% LAD stenosis performing a Medina 010 with the first diagonal.  Inferior akinesis. EF 55-60%. Normal LV end-diastolic pressure.   Patient Profile     56 y.o. male with multiple risk factors including smoking, hyperlipidemia on statin therapy, and family history. The patient presents 18-24 hours after onset of symptoms and has evidence of Q-wave inferior infarct on EKG.  Assessment & Plan    1.  CAD with late presentation of an acute inferior infarction total occlusion of the RCA moderately severe LAD stenosis - ASA, plavix, lopressor  2. HLD - LDL goal is less than 70 - 80 mg lipitor  3. Tobacco dependence - cessation and chantix   Recommendations:  He is asymptomatic and pain-free following recent infarction.  Cardiac rehabilitation has not seen yet.  We'll ask them to see him today and to walk in the hall.  We discussed diet, activity, and need for smoking cessation as well as risk factor modification.  Hopefully discharge later on today with follow-up by Dr. Eldridge Dace.  Darden Palmer MD [facility]

## 2016-10-09 NOTE — Discharge Summary (Signed)
Discharge Summary    Patient ID: Raymond Bowers,  MRN: [id number], DOB/AGE: July 24, 1960 56 y.o.  Admit date: 10/06/2016 Discharge date: 10/09/2016   Primary Care Provider: System, Pcp Not In Primary Cardiologist: Dr. Antoine Poche  Discharge Diagnoses    Principal Problem:   Inferior MI [facility]) Active Problems:   Tobacco abuse   Other secondary pulmonary hypertension (HCC)   Hyperlipidemia with target LDL less than 70   Allergies No Known Allergies   History of Present Illness     56 y.o. male with multiple risk factors including smoking, hyperlipidemia on statin therapy, and family history. The patient presents 18-24 hours after onset of symptoms and has evidence of Q-wave inferior infarct on EKG.  The patient has no past cardiac history.  He reports that at 1 am 10/06/16 he developed chest pain.  This was severe, mid upper pain.  He has not had this before.  He thought that it was reflux.  It radiated to his back and shoulders.  His arms hurt.  He walked around his house and took ASA.  The pain eased about two hours later.  He had some residual arm and chest soreness but was able to go to work.  He felt like he had some residual soreness so when he got home from work he came to the ED.  He says that he is currently pain free.  His initial troponin was greater than 25.  EKG demonstrates inferior Q waves with mild 1 mm ST elevation lead II with inverting T waves.  He has intermittent LBBB.    The patient otherwise had been well.  The patient denied any new symptoms such as chest discomfort, neck or arm discomfort. There has been no new shortness of breath, PND or orthopnea. There have been no reported palpitations, presyncope or syncope.  He is active at home.  Hospital Course     Consultants: None  Patient was admitted and underwent heart catheterization. LHC revealed CTO of RCA without significant collaterals and 70% stenosis of the mid-LAD. No intervention was performed. Plan for risk  factor modification.   Pt tolerated the procedure well. TR band removed from right radial artery site without bleeding or bruits.    Discharged on ASA and plavix for 1 year. Started 80 mg lipitor for LDL goal of less than 70. Toprol 25 mg daily.   Leukocytosis at discharge with WBC of 13.1 without signs of infection, afebrile. This is likely an acute phase reactant in the setting of a NSTEMI.  Patient seen and examined by Dr. Donnie Aho today and was stable for discharge. All follow up has been arranged.  _____________  Discharge Vitals Blood pressure 101/65, pulse 73, temperature 98.8 F (37.1 C), temperature source Oral, resp. rate (!) 22, height  (1.778 m), weight 186 lb 12.8 oz (84.7 kg), SpO2 97 %.  Filed Weights   10/06/16 2015 10/07/16 0050  Weight: 190 lb (86.2 kg) 186 lb 12.8 oz (84.7 kg)    Labs & Radiologic Studies    CBC  Recent Labs  10/07/16 0800 10/07/16 1226  WBC 12.2* 13.5*  HGB 15.0 15.7  HCT 43.8 45.0  MCV 98.9 98.7  PLT 186 167   Basic Metabolic Panel  Recent Labs  10/07/16 0800 10/07/16 1226 10/08/16 0405  NA 133*  --  136  K 3.9  --  3.8  CL 104  --  107  CO2 22  --  22  GLUCOSE 125*  --  109*  BUN 6  --  6  CREATININE 0.81 0.87 0.84  CALCIUM 8.5*  --  8.5*   Liver Function Tests No results for input(s): AST, ALT, ALKPHOS, BILITOT, PROT, ALBUMIN in the last 72 hours. No results for input(s): LIPASE, AMYLASE in the last 72 hours. Cardiac Enzymes  Recent Labs  10/07/16 0112 10/07/16 0800 10/07/16 1226  TROPONINI 36.33* 30.95* 24.45*   BNP Invalid input(s): POCBNP D-Dimer No results for input(s): DDIMER in the last 72 hours. Hemoglobin A1C  Recent Labs  10/08/16 0405  HGBA1C 5.3   Fasting Lipid Panel  Recent Labs  10/07/16 0112  CHOL 151  HDL 50  LDLCALC 85  TRIG 81  CHOLHDL 3.0   Thyroid Function Tests  Recent Labs  10/07/16 0112  TSH 1.180   _____________  Dg Chest Portable 1 View  Result Date:  10/06/2016 CLINICAL DATA:  Chest pain EXAM: PORTABLE CHEST 1 VIEW COMPARISON:  None. FINDINGS: Heart size within normal limits. No acute consolidation or effusion. Possible small calcified nodules in the right upper lobe. No pneumothorax. IMPRESSION: No active disease. Electronically Signed   By: Jasmine Pang M.D.   On: 10/06/2016 20:47     Diagnostic Studies/Procedures     Left heart cath 10/07/16:  Total occlusion of the mid right coronary without significant collaterals. TIMI grade 1 antegrade flow. Patient is asymptomatic. Intervention is not indicated.  Mid eccentric 70% LAD stenosis performing a Medina 010 with the first diagonal.  Inferior akinesis. EF 55-60%. Normal LV end-diastolic pressure.  Recommendations:  Risk factor modification including LDL less than 70, blood pressure 130/85 mmHg or less, smoking cessation, and screening for diabetes. High intensity statin therapy, optimization of beta blocker therapy, and Chantix have been started.  Plavix and aspirin, for 12 months for ACS.  Will need functional testing after discharge to assess for evidence of anterior ischemia.  Anticipate discharge in 36-48 hours if no complications or symptoms.  Patient desires to establish with Dr. Catalina Gravel.   Disposition   Pt is being discharged home today in good condition.  Follow-up Plans & Appointments   Staff message sent to pool for appt with Dr. Antoine Poche (TCM) and Dr. Eldridge Dace  Discharge Instructions    Diet - low sodium heart healthy    Complete by:  As directed    Discharge instructions    Complete by:  As directed    No driving for 48 hr. No lifting over 5 lbs for 1 week. No sexual activity for 1 week. Keep procedure site clean & dry. If you notice increased pain, swelling, bleeding or pus, call/return!  You may shower, but no soaking baths/hot tubs/pools for 1 week.   Increase activity slowly    Complete by:  As directed       Discharge Medications   Current  Discharge Medication List    START taking these medications   Details  atorvastatin (LIPITOR) 80 MG tablet Take 1 tablet (80 mg total) by mouth daily at 6 PM. Qty: 30 tablet, Refills: 11    clopidogrel (PLAVIX) 75 MG tablet Take 1 tablet (75 mg total) by mouth daily with breakfast. Qty: 30 tablet, Refills: 11    metoprolol succinate (TOPROL-XL) 25 MG 24 hr tablet Take 1 tablet (25 mg total) by mouth daily. Qty: 30 tablet, Refills: 11    nitroGLYCERIN (NITROSTAT) 0.4 MG SL tablet Place 1 tablet (0.4 mg total) under the tongue every 5 (five) minutes x 3 doses as needed for chest  pain. Qty: 20 tablet, Refills: 1      CONTINUE these medications which have NOT CHANGED   Details  CHANTIX CONTINUING MONTH PAK 1 MG tablet Take 1 mg by mouth daily.      STOP taking these medications     simvastatin (ZOCOR) 20 MG tablet          Aspirin prescribed at discharge?  Yes High Intensity Statin Prescribed? (Lipitor 40-80mg  or Crestor 20-40mg ): Yes Beta Blocker Prescribed? Yes For EF <40%, was ACEI/ARB Prescribed? No: nl EF ADP Receptor Inhibitor Prescribed? (i.e. Plavix etc.-Includes Medically Managed Patients): Yes For EF <40%, Aldosterone Inhibitor Prescribed? No: nl EF Was EF assessed during THIS hospitalization? Yes Was Cardiac Rehab II ordered? (Included Medically managed Patients): Yes   Outstanding Labs/Studies   ASA and plavix x 1 year  Follow up with Dr. Eldridge Dace for CTO  Needs cardiac rehab (did not see in hospital, message left)   Duration of Discharge Encounter   Greater than 30 minutes including physician time.  Signed, Roe Rutherford Duke PA-C 10/09/2016, 10:27 AM  Agree with above.  Patient seen earlier and in note.  Darden Palmer MD [facility]

## 2016-10-10 ENCOUNTER — Telehealth: Payer: Self-pay | Admitting: Cardiology

## 2016-10-10 ENCOUNTER — Telehealth (HOSPITAL_COMMUNITY): Payer: Self-pay | Admitting: *Deleted

## 2016-10-10 MED FILL — Lidocaine HCl Local Inj 2%: INTRAMUSCULAR | Qty: 10 | Status: AC

## 2016-10-10 NOTE — Telephone Encounter (Signed)
Looks like pt had LHEART CATH 10-07-16.10/06/2016 - 10/09/2016 (3 days) [facility]. Tried to call pt, phone just keeps ringing, there is no release to speak with a Vicenta Dunning, tried to call and "the mailbox is not set up" pt needs to schedule hospital follow up appt.

## 2016-10-10 NOTE — Telephone Encounter (Signed)
Follow up      Returned call to the nurse.  I called the pt and set up an hosp follow up appt for 10-13-16.  However, son stated that he still need to talk to the nurse.  Not sure what he want.  Please call.

## 2016-10-10 NOTE — Telephone Encounter (Signed)
Attempted to return call to PATIENT No DPR on file to speak with Raymond Bowers Phone rang continuously without any answer

## 2016-10-10 NOTE — Telephone Encounter (Signed)
New message  Pt son call requesting to speak with Rn about procedure on 9/28. Pt son has some questions he is unsure about. Please call back to discuss

## 2016-10-12 NOTE — Progress Notes (Signed)
Cardiology Office Note   Date:  10/14/2016   ID:  Raymond Bowers, DOB [birth year], MRN [id number]  PCP:  Kaleen Mask, MD  Cardiologist:   Rollene Rotunda, MD    Chief Complaint  Patient presents with  . Coronary Artery Disease      History of Present Illness: Raymond Bowers is a 56 y.o. male who presents for follow up after cardiac cath and late presentation for an inferior MI.  He presented with elevated enzymes 20 hours after his symptoms started.  He had a cardiac cath with disease as described below. He was managed medically. Since then he's done well. He's been eating better. He's not been smoking. He's been doing a little walking. The patient denies any new symptoms such as chest discomfort, neck or arm discomfort. There has been no new shortness of breath, PND or orthopnea. There have been no reported palpitations, presyncope or syncope.   Past Medical History:  Diagnosis Date  . Dyslipidemia   . 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 Tobacco dependence     Past Surgical History:  Procedure Laterality Date  . LEFT HEART CATH AND CORONARY ANGIOGRAPHY N/A 10/07/2016   Procedure: LEFT HEART CATH AND CORONARY ANGIOGRAPHY;  Surgeon: Lyn Records, MD;  Location: [facility] INVASIVE CV LAB;  Service: Cardiovascular;  Laterality: N/A;  . TONSILLECTOMY    . ULTRASOUND GUIDANCE FOR VASCULAR ACCESS  10/07/2016   Procedure: Ultrasound Guidance For Vascular Access;  Surgeon: Lyn Records, MD;  Location: [facility] INVASIVE CV LAB;  Service: Cardiovascular;;  . WRIST SURGERY       Current Outpatient Prescriptions  Medication Sig Dispense Refill  . atorvastatin (LIPITOR) 80 MG tablet Take 1 tablet (80 mg total) by mouth daily at 6 PM. 30 tablet 11  . CHANTIX CONTINUING MONTH PAK 1 MG tablet Take 1 mg by mouth daily.    . clopidogrel (PLAVIX) 75 MG tablet Take 1 tablet (75 mg total) by mouth daily with breakfast. 30 tablet 11  . metoprolol succinate  (TOPROL-XL) 25 MG 24 hr tablet Take 1 tablet (25 mg total) by mouth daily. 30 tablet 11  . nitroGLYCERIN (NITROSTAT) 0.4 MG SL tablet Place 1 tablet (0.4 mg total) under the tongue every 5 (five) minutes x 3 doses as needed for chest pain. 20 tablet 1   No current facility-administered medications for this visit.     Allergies:   Patient has no known allergies.    ROS:  Please see the history of present illness.   Otherwise, review of systems are positive for none.   All other systems are reviewed and negative.    PHYSICAL EXAM: VS:  BP 122/79   Pulse 75   Ht  (1.778 m)   Wt 185 lb 12.8 oz (84.3 kg)   BMI 26.66 kg/m  , BMI Body mass index is 26.66 kg/m. GENERAL:  Well appearing HEENT:  Pupils equal round and reactive, fundi not visualized, oral mucosa unremarkable NECK:  No jugular venous distention, waveform within normal limits, carotid upstroke brisk and symmetric, no bruits, no thyromegaly LYMPHATICS:  No cervical, inguinal adenopathy LUNGS:  Clear to auscultation bilaterally BACK:  No CVA tenderness CHEST:  Unremarkable HEART:  PMI not displaced or sustained,S1 and S2 within normal limits, no S3, no S4, no clicks, no rubs, no murmurs ABD:  Flat, positive bowel sounds normal in frequency in pitch, no bruits, no rebound, no guarding, no  midline pulsatile mass, no hepatomegaly, no splenomegaly EXT:  2 plus pulses throughout, no edema, no cyanosis no clubbing SKIN:  No rashes no nodules NEURO:  Cranial nerves II through XII grossly intact, motor grossly intact throughout PSYCH:  Cognitively intact, oriented to person place and time    EKG:  EKG is not ordered today.   CARDIAC CATH:     Total occlusion of the mid right coronary without significant collaterals. TIMI grade 1 antegrade flow. Patient is asymptomatic. Intervention is not indicated.  Mid eccentric 70% LAD stenosis performing a Medina 010 with the first diagonal.  Inferior akinesis. EF 55-60%. Normal LV  end-diastolic pressure.   Recent Labs: 10/07/2016: Hemoglobin 15.7; Platelets 167; TSH 1.180 10/08/2016: BUN 6; Creatinine, Ser 0.84; Potassium 3.8; Sodium 136    Lipid Panel    Component Value Date/Time   CHOL 151 10/07/2016 0112   TRIG 81 10/07/2016 0112   HDL 50 10/07/2016 0112   CHOLHDL 3.0 10/07/2016 0112   VLDL 16 10/07/2016 0112   LDLCALC 85 10/07/2016 0112      Wt Readings from Last 3 Encounters:  10/13/16 185 lb 12.8 oz (84.3 kg)  10/07/16 186 lb 12.8 oz (84.7 kg)      Other studies Reviewed: Additional studies/ records that were reviewed today include: Hospital records.. Review of the above records demonstrates:  Please see elsewhere in the note.     ASSESSMENT AND PLAN:    CAD:  I will bring him back in one month for a Lexiscan Myoview to further assess the hemodynamic significance of the LAD lesion. He will continue with aggressive risk reduction.  I will refer him to cardiac rehab.   Of note he was instructed to start taking an ASA with his Plavix.    TOBACCO ABUSE:  He has stopped smoking.  He will complete treatment with Chantix.  DYSLIPIDEMIA:  I will check a lipid profile in 8 weeks.     Current medicines are reviewed at length with the patient today.  The patient does not have concerns regarding medicines.  The following changes have been made:  no change  Labs/ tests ordered today include:   Orders Placed This Encounter  Procedures  . MYOCARDIAL PERFUSION IMAGING     Disposition:   FU with me in two months.     Signed, Rollene Rotunda, MD  10/14/2016 12:59 PM    [facility]

## 2016-10-13 ENCOUNTER — Ambulatory Visit (INDEPENDENT_AMBULATORY_CARE_PROVIDER_SITE_OTHER): Payer: Commercial Managed Care - PPO | Admitting: Cardiology

## 2016-10-13 ENCOUNTER — Encounter: Payer: Self-pay | Admitting: Cardiology

## 2016-10-13 VITALS — BP 122/79 | HR 75 | Ht 70.0 in | Wt 185.8 lb

## 2016-10-13 DIAGNOSIS — Z720 Tobacco use: Secondary | ICD-10-CM

## 2016-10-13 DIAGNOSIS — E785 Hyperlipidemia, unspecified: Secondary | ICD-10-CM

## 2016-10-13 DIAGNOSIS — I2119 ST elevation (STEMI) myocardial infarction involving other coronary artery of inferior wall: Secondary | ICD-10-CM

## 2016-10-13 NOTE — Patient Instructions (Signed)
Medication Instructions:  Continue current medications  If you need a refill on your cardiac medications before your next appointment, please call your pharmacy.  Labwork: None Ordered   Testing/Procedures: Your physician has requested that you have a lexiscan myoview. For further information please visit [email or web address] Please follow instruction sheet, as given.   Follow-Up: Your physician wants you to follow-up in: 2 Months. .   Thank you for choosing [facility] at [facility]!!

## 2016-10-14 ENCOUNTER — Encounter: Payer: Self-pay | Admitting: Cardiology

## 2016-10-14 ENCOUNTER — Telehealth: Payer: Self-pay | Admitting: *Deleted

## 2016-10-14 DIAGNOSIS — I2119 ST elevation (STEMI) myocardial infarction involving other coronary artery of inferior wall: Secondary | ICD-10-CM

## 2016-10-14 NOTE — Telephone Encounter (Signed)
-----   Message from Rollene Rotunda, MD sent at 10/13/2016  5:09 PM EDT ----- Needs cardiac rehab referral.

## 2016-10-14 NOTE — Telephone Encounter (Signed)
Referral for cardiac rehab placed.

## 2016-10-20 ENCOUNTER — Telehealth (HOSPITAL_COMMUNITY): Payer: Self-pay

## 2016-10-20 NOTE — Telephone Encounter (Signed)
Encounter complete. 

## 2016-10-25 ENCOUNTER — Ambulatory Visit (HOSPITAL_COMMUNITY)
Admission: RE | Admit: 2016-10-25 | Discharge: 2016-10-25 | Disposition: A | Discharge status: Home / Self Care | Payer: Commercial Managed Care - PPO | Source: Ambulatory Visit | Attending: Cardiology | Admitting: Cardiology

## 2016-10-25 DIAGNOSIS — E785 Hyperlipidemia, unspecified: Secondary | ICD-10-CM | POA: Diagnosis not present

## 2016-10-25 DIAGNOSIS — Z8249 Family history of ischemic heart disease and other diseases of the circulatory system: Secondary | ICD-10-CM | POA: Diagnosis not present

## 2016-10-25 DIAGNOSIS — Z87891 Personal history of nicotine dependence: Secondary | ICD-10-CM | POA: Diagnosis not present

## 2016-10-25 DIAGNOSIS — I2119 ST elevation (STEMI) myocardial infarction involving other coronary artery of inferior wall: Secondary | ICD-10-CM | POA: Insufficient documentation

## 2016-10-25 DIAGNOSIS — I2510 Atherosclerotic heart disease of native coronary artery without angina pectoris: Secondary | ICD-10-CM | POA: Diagnosis not present

## 2016-10-25 DIAGNOSIS — I10 Essential (primary) hypertension: Secondary | ICD-10-CM | POA: Insufficient documentation

## 2016-10-25 LAB — MYOCARDIAL PERFUSION IMAGING
CHL CUP NUCLEAR SSS: 10
CSEPPHR: 90 {beats}/min
LV sys vol: 55 mL
LVDIAVOL: 111 mL (ref 62–150)
Rest HR: 68 {beats}/min
SDS: 4
SRS: 6
TID: 1.05

## 2016-10-25 MED ORDER — REGADENOSON 0.4 MG/5ML IV SOLN
0.4000 mg | Freq: Once | INTRAVENOUS | Status: AC
  Administered 2016-10-25: 0.4 mg via INTRAVENOUS

## 2016-10-25 MED ORDER — TECHNETIUM TC 99M TETROFOSMIN IV KIT
9.9000 | PACK | Freq: Once | INTRAVENOUS | Status: AC | PRN
  Administered 2016-10-25: 9.9 via INTRAVENOUS
  Filled 2016-10-25: qty 10

## 2016-10-25 MED ORDER — TECHNETIUM TC 99M TETROFOSMIN IV KIT
29.5000 | PACK | Freq: Once | INTRAVENOUS | Status: AC | PRN
  Administered 2016-10-25: 29.5 via INTRAVENOUS
  Filled 2016-10-25: qty 30

## 2016-11-07 ENCOUNTER — Telehealth (HOSPITAL_COMMUNITY): Payer: Self-pay | Admitting: *Deleted

## 2016-11-07 NOTE — Telephone Encounter (Signed)
-----   Message from Rollene RotundaJames Hochrein, MD sent at 11/06/2016  9:06 PM EDT ----- Regarding: RE: Ok to proceed with cardiac rehab Ok to start rehab.    ----- Message ----- From: Chelsea Ausarlton, Carlette B, RN Sent: 10/25/2016   2:37 PM To: Rollene RotundaJames Hochrein, MD Subject: Ok to proceed with cardiac rehab               Dr. Antoine PocheHochrein,  You referred the above pt to cardiac rehab phase II.  Pt had a lexiscan myoview today for his LAD lesion.  Based upon pt results, ok to proceed with exercise at cardiac rehab?  Thanks for your advisement  Karlene Linemanarlette Carlton RN, BSN Cardiac and Pulmonary Rehab Nurse Navigator

## 2016-11-11 ENCOUNTER — Telehealth (HOSPITAL_COMMUNITY): Payer: Self-pay

## 2016-11-11 NOTE — Telephone Encounter (Signed)
Called and spoke with patient in regards to Cardiac Rehab - Patient stated that he has got to make decisions. He received brochure and stated the class times may be an in issue with work. He accepted a job that will be leaving the first of the year for 18 months. He would like for me to call back in a couple of weeks.

## 2016-11-28 ENCOUNTER — Telehealth (HOSPITAL_COMMUNITY): Payer: Self-pay

## 2016-11-28 NOTE — Telephone Encounter (Signed)
Called and spoke with patient in regards to Cardiac Rehab - Patient can not participate in program at this time due to his new work schedule and location. Will close referral.

## 2016-12-07 ENCOUNTER — Encounter: Payer: Self-pay | Admitting: Cardiology

## 2016-12-14 NOTE — Progress Notes (Signed)
Cardiology Office Note   Date:  12/15/2016   ID:  Raymond CornerJimmy Bowers, DOB 26-Oct-1960, MRN [id number]  PCP:  Kaleen MaskElkins, Wilson Oliver, MD  Cardiologist:   Rollene RotundaJames Teara Duerksen, MD    Chief Complaint  Patient presents with  . Coronary Artery Disease      History of Present Illness: Raymond Bowers is a [age] y.o. male who presents for follow up after cardiac cath and late presentation for an inferior MI.  He presented with elevated enzymes 20 hours after his symptoms started.  He had a cardiac cath with disease as described below. After the last visit I sent him for a Lexiscan Myoview.  He had no ischemia in the distribution of the LAD lesion.  Since I last saw her she has done well.  The patient denies any new symptoms such as chest discomfort, neck or arm discomfort. There has been no new shortness of breath, PND or orthopnea. There have been no reported palpitations, presyncope or syncope. He is walking daily.    Past Medical History:  Diagnosis Date  . Dyslipidemia   . 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 Tobacco dependence     Past Surgical History:  Procedure Laterality Date  . LEFT HEART CATH AND CORONARY ANGIOGRAPHY N/A 10/07/2016   Procedure: LEFT HEART CATH AND CORONARY ANGIOGRAPHY;  Surgeon: Lyn RecordsSmith, Henry W, MD;  [facility];  Service: Cardiovascular;  Laterality: N/A;  . TONSILLECTOMY    . ULTRASOUND GUIDANCE FOR VASCULAR ACCESS  10/07/2016   Procedure: Ultrasound Guidance For Vascular Access;  Surgeon: Lyn RecordsSmith, Henry W, MD;  [facility];  Service: Cardiovascular;;  . WRIST SURGERY       Current Outpatient Medications  Medication Sig Dispense Refill  . atorvastatin (LIPITOR) 80 MG tablet Take 1 tablet (80 mg total) by mouth daily at 6 PM. 30 tablet 11  . clopidogrel (PLAVIX) 75 MG tablet Take 1 tablet (75 mg total) by mouth daily with breakfast. 30 tablet 11  . metoprolol succinate (TOPROL-XL) 25 MG 24 hr tablet Take 1  tablet (25 mg total) by mouth daily. 30 tablet 11  . nitroGLYCERIN (NITROSTAT) 0.4 MG SL tablet Place 1 tablet (0.4 mg total) under the tongue every 5 (five) minutes x 3 doses as needed for chest pain. 20 tablet 1   No current facility-administered medications for this visit.     Allergies:   Patient has no known allergies.    ROS:  Please see the history of present illness.   Otherwise, review of systems are positive for none.   All other systems are reviewed and negative.    PHYSICAL EXAM: VS:  BP 119/79   Pulse 67   Ht 5\' 10"  (1.778 m)   Wt 196 lb (88.9 kg)   BMI 28.12 kg/m  , BMI Body mass index is 28.12 kg/m.  GENERAL:  Well appearing NECK:  No jugular venous distention, waveform within normal limits, carotid upstroke brisk and symmetric, no bruits, no thyromegaly LUNGS:  Clear to auscultation bilaterally CHEST:  Unremarkable HEART:  PMI not displaced or sustained,S1 and S2 within normal limits, no S3, no S4, no clicks, no rubs, no murmurs ABD:  Flat, positive bowel sounds normal in frequency in pitch, no bruits, no rebound, no guarding, no midline pulsatile mass, no hepatomegaly, no splenomegaly EXT:  2 plus pulses throughout, no edema, no cyanosis no clubbing   EKG:  EKG is not  ordered today.   CARDIAC CATH:     Total occlusion of the mid right coronary without significant collaterals. TIMI grade 1 antegrade flow. Patient is asymptomatic. Intervention is not indicated.  Mid eccentric 70% LAD stenosis performing a Medina 010 with the first diagonal.  Inferior akinesis. EF 55-60%. Normal LV end-diastolic pressure.   Recent Labs: 10/07/2016: Hemoglobin 15.7; Platelets 167; TSH 1.180 10/08/2016: BUN 6; Creatinine, Ser 0.84; Potassium 3.8; Sodium 136    Lipid Panel    Component Value Date/Time   CHOL 151 10/07/2016 0112   TRIG 81 10/07/2016 0112   HDL 50 10/07/2016 0112   CHOLHDL 3.0 10/07/2016 0112   VLDL 16 10/07/2016 0112   LDLCALC 85 10/07/2016 0112       Wt Readings from Last 3 Encounters:  12/15/16 196 lb (88.9 kg)  10/25/16 185 lb (83.9 kg)  10/13/16 185 lb 12.8 oz (84.3 kg)      Other studies Reviewed: Additional studies/ records that were reviewed today include:None Review of the above records demonstrates:  Please see elsewhere in the note.     ASSESSMENT AND PLAN:    CAD:   The Lexiscan Myoview had results as above.  He will continue DAPT per one year per guidelines.    TOBACCO ABUSE:   He is still not smoking.   DYSLIPIDEMIA:    He will have a lipid profile checked.    Current medicines are reviewed at length with the patient today.  The patient does not have concerns regarding medicines.  The following changes have been made:  None  Labs/ tests ordered today include:  None  No orders of the defined types were placed in this encounter.    Disposition:   FU with me in Sept.  Signed, Rollene RotundaJames Tauri Ethington, MD  12/15/2016 4:59 PM    [facility]

## 2016-12-15 ENCOUNTER — Ambulatory Visit: Payer: Commercial Managed Care - PPO | Admitting: Cardiology

## 2016-12-15 ENCOUNTER — Encounter: Payer: Self-pay | Admitting: Cardiology

## 2016-12-15 VITALS — BP 119/79 | HR 67 | Ht 70.0 in | Wt 196.0 lb

## 2016-12-15 DIAGNOSIS — I2510 Atherosclerotic heart disease of native coronary artery without angina pectoris: Secondary | ICD-10-CM | POA: Diagnosis not present

## 2016-12-15 DIAGNOSIS — E785 Hyperlipidemia, unspecified: Secondary | ICD-10-CM | POA: Diagnosis not present

## 2016-12-15 NOTE — Patient Instructions (Signed)
Medication Instructions:  Continue current medications  If you need a refill on your cardiac medications before your next appointment, please call your pharmacy.  Labwork: None ordered   Testing/Procedures: None ordered   Follow-Up: Your physician wants you to follow-up in: September 2019. You should receive a reminder letter in the mail two months in advance. If you do not receive a letter, please call our office [phone].   Get Fasting Lipids at primary care physician   Thank you for choosing [facility]!!

## 2017-01-04 ENCOUNTER — Telehealth: Payer: Self-pay | Admitting: Cardiology

## 2017-01-04 DIAGNOSIS — E785 Hyperlipidemia, unspecified: Secondary | ICD-10-CM

## 2017-01-04 NOTE — Telephone Encounter (Signed)
Per last OV with Dr. Antoine PocheHochrein:  12/06 DYSLIPIDEMIA:    He will have a lipid profile checked.   Follow-Up: Your physician wants you to follow-up in: September 2019. You should receive a reminder letter in the mail two months in advance. If you do not receive a letter, please call our office [phone].   Get Fasting Lipids at primary care physician   Order placed for Lipid panel, faxed to Dr. Jeannetta NapElkins

## 2017-01-04 NOTE — Telephone Encounter (Signed)
Raymond Bowers is calling because he need the orders for the lab to be faxed to [phone] at Dr. Jeannetta NapElkins office . Please call if you have any questions

## 2017-09-12 ENCOUNTER — Encounter: Payer: Self-pay | Admitting: Cardiology

## 2017-09-13 ENCOUNTER — Other Ambulatory Visit: Payer: Self-pay | Admitting: Family Medicine

## 2017-09-13 DIAGNOSIS — Z87891 Personal history of nicotine dependence: Secondary | ICD-10-CM

## 2017-09-13 DIAGNOSIS — Z122 Encounter for screening for malignant neoplasm of respiratory organs: Secondary | ICD-10-CM

## 2017-09-14 ENCOUNTER — Encounter: Payer: Self-pay | Admitting: Cardiology

## 2017-09-20 ENCOUNTER — Ambulatory Visit
Admission: RE | Admit: 2017-09-20 | Discharge: 2017-09-20 | Disposition: A | Discharge status: Home / Self Care | Payer: Commercial Managed Care - PPO | Source: Ambulatory Visit | Attending: Family Medicine | Admitting: Family Medicine

## 2017-09-20 DIAGNOSIS — Z87891 Personal history of nicotine dependence: Secondary | ICD-10-CM

## 2017-09-20 DIAGNOSIS — Z122 Encounter for screening for malignant neoplasm of respiratory organs: Secondary | ICD-10-CM

## 2017-10-02 ENCOUNTER — Telehealth: Payer: Self-pay | Admitting: Cardiology

## 2017-10-02 NOTE — Telephone Encounter (Signed)
Follow up  Patient needs the medicine indicated in the last message [facility] [place]

## 2017-10-02 NOTE — Telephone Encounter (Signed)
New Message:      *STAT* If patient is at the pharmacy, call can be transferred to refill team.   1. Which medications need to be refilled? (please list name of each medication and dose if known) metoprolol succinate (TOPROL-XL) 25 MG 24 hr tablet and clopidogrel (PLAVIX) 75 MG tablet  2. Which pharmacy/location (including street and city if local pharmacy) is medication to be sent to? Take 1 tablet (25 mg total) by mouth daily and Take 1 tablet (75 mg total) by mouth daily with breakfast.  3. Do they need a 30 day or 90 day supply? 30 Days

## 2017-10-02 NOTE — Telephone Encounter (Signed)
No message needed °

## 2017-10-03 ENCOUNTER — Telehealth: Payer: Self-pay

## 2017-10-03 MED ORDER — CLOPIDOGREL BISULFATE 75 MG PO TABS: 75.0000 mg | ORAL_TABLET | Freq: Every day | ORAL | 3 refills | Status: DC

## 2017-10-03 MED ORDER — METOPROLOL SUCCINATE ER 25 MG PO TB24: 25.0000 mg | ORAL_TABLET | Freq: Every day | ORAL | 3 refills | Status: DC

## 2017-10-03 NOTE — Telephone Encounter (Signed)
Received call from patient stated he needs plavix and metoprolol refilled.Stated he has appointment scheduled with Dr.Hochrein 11/04/17.Refills sent to pharmacy.

## 2017-10-30 NOTE — Progress Notes (Signed)
Cardiology Office Note   Date:  10/31/2017   ID:  Raymond Bowers, DOB 01-07-1961, MRN [id number]  PCP:  Kaleen Mask, MD  Cardiologist:   Rollene Rotunda, MD    Chief Complaint  Patient presents with  . Coronary Artery Disease      History of Present Illness: Raymond Bowers is a 57 y.o. male who presents for follow up after cardiac cath and late presentation for an inferior MI.  He presented with elevated enzymes 20 hours after his symptoms started.  He had a cardiac cath with disease as described below. After the last visit I sent him for a Lexiscan Myoview.  He had no ischemia in the distribution of the LAD lesion.  Since I last saw him he has done well.  He is working at [employer] building a [employer].  While he is up there he does a lot of walking and hiking in the woods.The patient denies any new symptoms such as chest discomfort, neck or arm discomfort. There has been no new shortness of breath, PND or orthopnea. There have been no reported palpitations, presyncope or syncope.     Past Medical History:  Diagnosis Date  . Dyslipidemia   . 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 Tobacco dependence     Past Surgical History:  Procedure Laterality Date  . LEFT HEART CATH AND CORONARY ANGIOGRAPHY N/A 10/07/2016   Procedure: LEFT HEART CATH AND CORONARY ANGIOGRAPHY;  Surgeon: Lyn Records, MD;  [facility];  Service: Cardiovascular;  Laterality: N/A;  . TONSILLECTOMY    . ULTRASOUND GUIDANCE FOR VASCULAR ACCESS  10/07/2016   Procedure: Ultrasound Guidance For Vascular Access;  Surgeon: Lyn Records, MD;  Location: [facility] INVASIVE CV LAB;  Service: Cardiovascular;;  . WRIST SURGERY       Current Outpatient Medications  Medication Sig Dispense Refill  . atorvastatin (LIPITOR) 80 MG tablet Take 1 tablet (80 mg total) by mouth daily at 6 PM. 90 tablet 3  . metoprolol succinate (TOPROL-XL) 25 MG 24 hr  tablet Take 1 tablet (25 mg total) by mouth daily. 90 tablet 3  . nitroGLYCERIN (NITROSTAT) 0.4 MG SL tablet Place 1 tablet (0.4 mg total) under the tongue every 5 (five) minutes x 3 doses as needed for chest pain. 20 tablet 1   No current facility-administered medications for this visit.     Allergies:   Patient has no known allergies.    ROS:  Please see the history of present illness.   Otherwise, review of systems are positive for easy bruising..   All other systems are reviewed and negative.    PHYSICAL EXAM: VS:  BP 120/82 (BP Location: Right Arm, Patient Position: Sitting, Cuff Size: Normal)   Pulse 63   Ht 5\' 10"  (1.778 m)   Wt 210 lb (95.3 kg)   SpO2 97%   BMI 30.13 kg/m  , BMI Body mass index is 30.13 kg/m.  GENERAL:  Well appearing NECK:  No jugular venous distention, waveform within normal limits, carotid upstroke brisk and symmetric, no bruits, no thyromegaly LUNGS:  Clear to auscultation bilaterally CHEST:  Unremarkable HEART:  PMI not displaced or sustained,S1 and S2 within normal limits, no S3, no S4, no clicks, no rubs, no murmurs ABD:  Flat, positive bowel sounds normal in frequency in pitch, no bruits, no rebound, no guarding, no midline pulsatile mass, no hepatomegaly, no splenomegaly, positive  umbilical hernia EXT:  2 plus pulses throughout, no edema, no cyanosis no clubbing   EKG:  EKG is  ordered today. Sinus rhythm, rate 63, axis within normal limits, intervals within normal limits, old inferior infarct, QRS prolongation from previous is no longer present.   CARDIAC CATH:     Total occlusion of the mid right coronary without significant collaterals. TIMI grade 1 antegrade flow. Patient is asymptomatic. Intervention is not indicated.  Mid eccentric 70% LAD stenosis performing a Medina 010 with the first diagonal.  Inferior akinesis. EF 55-60%. Normal LV end-diastolic pressure.   Recent Labs: No results found for requested labs within last 8760  hours.    Lipid Panel    Component Value Date/Time   CHOL 151 10/07/2016 0112   TRIG 81 10/07/2016 0112   HDL 50 10/07/2016 0112   CHOLHDL 3.0 10/07/2016 0112   VLDL 16 10/07/2016 0112   LDLCALC 85 10/07/2016 0112      Wt Readings from Last 3 Encounters:  10/31/17 210 lb (95.3 kg)  12/15/16 196 lb (88.9 kg)  10/25/16 185 lb (83.9 kg)      Other studies Reviewed: Additional studies/ records that were reviewed today include:  Labs.   Review of the above records demonstrates:  See below   ASSESSMENT AND PLAN:   CAD:    The patient has no new sypmtoms.  No further cardiovascular testing is indicated.  We will continue with aggressive risk reduction and meds as listed.  He can stop his Plavix.  TOBACCO ABUSE:    He has given up smoking for good.  DYSLIPIDEMIA:    His LDL was 70.   He will remain on the meds as listed.   Current medicines are reviewed at length with the patient today.  The patient does not have concerns regarding medicines.  The following changes have been made:  As above  Labs/ tests ordered today include:  None   No orders of the defined types were placed in this encounter.    Disposition:   FU with me in 12 months. Forest Becker, MD  10/31/2017 4:32 PM    [facility]

## 2017-10-31 ENCOUNTER — Ambulatory Visit: Payer: Commercial Managed Care - PPO | Admitting: Cardiology

## 2017-10-31 ENCOUNTER — Encounter: Payer: Self-pay | Admitting: Cardiology

## 2017-10-31 VITALS — BP 120/82 | HR 63 | Ht 70.0 in | Wt 210.0 lb

## 2017-10-31 DIAGNOSIS — E785 Hyperlipidemia, unspecified: Secondary | ICD-10-CM

## 2017-10-31 DIAGNOSIS — I2510 Atherosclerotic heart disease of native coronary artery without angina pectoris: Secondary | ICD-10-CM

## 2017-10-31 MED ORDER — METOPROLOL SUCCINATE ER 25 MG PO TB24: 25.0000 mg | ORAL_TABLET | Freq: Every day | ORAL | 3 refills | Status: DC

## 2017-10-31 MED ORDER — ATORVASTATIN CALCIUM 80 MG PO TABS: 80.0000 mg | ORAL_TABLET | Freq: Every day | ORAL | 3 refills | Status: DC

## 2017-10-31 NOTE — Patient Instructions (Signed)
Medication Instructions:  STOP- Plavix   If you need a refill on your cardiac medications before your next appointment, please call your pharmacy.  Labwork: None Ordered  If you have labs (blood work) drawn today and your tests are completely normal, you will receive your results only by: Marland Kitchen MyChart Message (if you have MyChart) OR . A paper copy in the mail If you have any lab test that is abnormal or we need to change your treatment, we will call you to review the results.  Testing/Procedures: None Ordered  Follow-Up: You will need a follow up appointment in 1 Year.  Please call our office 2 months in [phone]) to schedule the appointment.  You may see  DR Antoine Poche or one of the following Advanced Practice Providers on your designated Care Team:   . Joni Reining, DNP, ANP . Rhonda Barrett, PA-C .  Marland Kitchen Corine Shelter, PA-C . Dot Lanes Kroeger, PA-C . Marjie Skiff, PA-C .  Marland Kitchen Azalee Course, PA-C . Micah Flesher, PA-C  At [facility], you and your health needs are our priority.  As part of our continuing mission to provide you with exceptional heart care, we have created designated Provider Care Teams.  These Care Teams include your primary Cardiologist (physician) and Advanced Practice Providers (APPs -  Physician Assistants and Nurse Practitioners) who all work together to provide you with the care you need, when you need it.    Thank you for choosing [facility] at [facility]!!

## 2017-11-01 NOTE — Addendum Note (Signed)
Addended by: Carren Rang on: 11/01/2017 11:26 AM   Modules accepted: Orders

## 2018-09-20 IMAGING — DX DG CHEST 1V PORT
1 series · 1 of 1 positions shown · non-contrast
Comparison: None.

CLINICAL DATA: Chest pain

EXAM:
PORTABLE CHEST 1 VIEW

[chest]
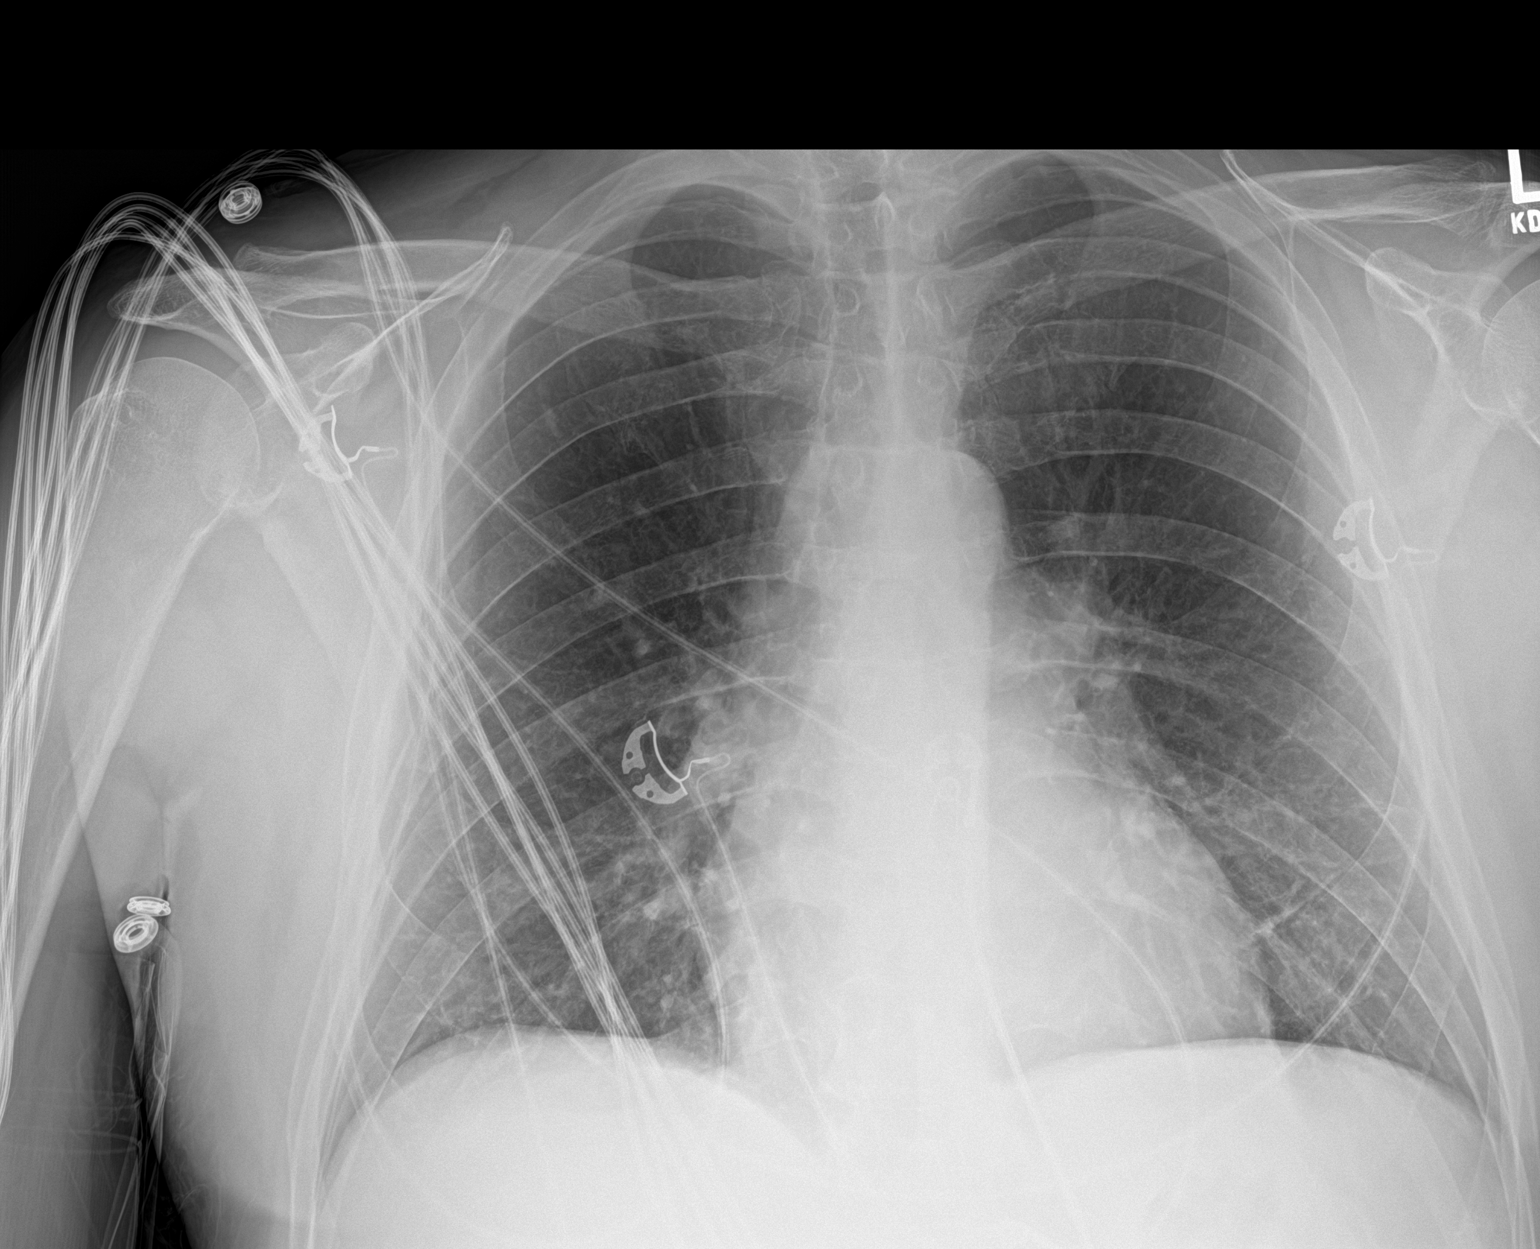

[1 of 1 positions shown; findings below may reference images not displayed]

FINDINGS: Heart size within normal limits. No acute consolidation or effusion.
Possible small calcified nodules in the right upper lobe. No
pneumothorax.
IMPRESSION: No active disease.

## 2018-10-16 ENCOUNTER — Other Ambulatory Visit: Payer: Self-pay | Admitting: Cardiology

## 2018-11-14 DIAGNOSIS — I2510 Atherosclerotic heart disease of native coronary artery without angina pectoris: Secondary | ICD-10-CM | POA: Insufficient documentation

## 2018-11-14 DIAGNOSIS — E785 Hyperlipidemia, unspecified: Secondary | ICD-10-CM | POA: Insufficient documentation

## 2018-11-14 NOTE — Progress Notes (Signed)
Cardiology Office Note   Date:  11/15/2018   ID:  Sivan Quast, DOB 1960-09-13, MRN [id number]  PCP:  Leonard Downing, MD  Cardiologist:   Minus Breeding, MD    Chief Complaint  Patient presents with  . Coronary Artery Disease      History of Present Illness: Raymond Bowers is a 58 y.o. male who presents for follow up after cardiac cath and late presentation for an inferior MI.  He presented with elevated enzymes 20 hours after his symptoms started.  He had a cardiac cath with disease as described below. On a [place] he had no ischemia in the distribution of the LAD lesion.  Since I last saw him he has done well.  He works as a Government social research officer.  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  He walks around his work sites.  He denies cardiovascular symptoms.  He has had no chest pressure, neck or arm discomfort.  Has had no palpitations, presyncope or syncope.  He has had none of the symptoms that he had previously.   Past Medical History:  Diagnosis Date  . Dyslipidemia   . Inferior MI ([place]) 10/07/2016   LHC 10/07/16 with CTO RCA, 70% mid LAD  . Tobacco dependence     Past Surgical History:  Procedure Laterality Date  . LEFT HEART CATH AND CORONARY ANGIOGRAPHY N/A 10/07/2016   Procedure: LEFT HEART CATH AND CORONARY ANGIOGRAPHY;  Surgeon: Belva Crome, MD;  Location: [place] CV LAB;  Service: Cardiovascular;  Laterality: N/A;  . TONSILLECTOMY    . ULTRASOUND GUIDANCE FOR VASCULAR ACCESS  10/07/2016   Procedure: Ultrasound Guidance For Vascular Access;  Surgeon: Belva Crome, MD;  Location: [place] CV LAB;  Service: Cardiovascular;;  . WRIST SURGERY       Current Outpatient Medications  Medication Sig Dispense Refill  . aspirin EC 81 MG tablet Take 81 mg by mouth daily.    Marland Kitchen atorvastatin (LIPITOR) 80 MG tablet Take 1 tablet (80 mg total) by mouth daily at 6 PM. Keep November appointment 30 tablet 11  . metoprolol succinate (TOPROL-XL) 25 MG 24 hr tablet Take 1  tablet (25 mg total) by mouth daily. Keep November appointment 30 tablet 11  . nitroGLYCERIN (NITROSTAT) 0.4 MG SL tablet Place 1 tablet (0.4 mg total) under the tongue every 5 (five) minutes x 3 doses as needed for chest pain. 25 tablet 3   No current facility-administered medications for this visit.     Allergies:   Patient has no known allergies.    ROS:  Please see the history of present illness.   Otherwise, review of systems are positive for none..   All other systems are reviewed and negative.    PHYSICAL EXAM: VS:  BP 134/86   Pulse 76   Temp 97.7 F (36.5 C)   Ht 5\' 9"  (1.753 m)   Wt 210 lb 6.4 oz (95.4 kg)   SpO2 98%   BMI 31.07 kg/m  , BMI Body mass index is 31.07 kg/m.  GENERAL:  Well appearing NECK:  No jugular venous distention, waveform within normal limits, carotid upstroke brisk and symmetric, no bruits, no thyromegaly LUNGS:  Clear to auscultation bilaterally CHEST:  Unremarkable HEART:  PMI not displaced or sustained,S1 and S2 within normal limits, no S3, no S4, no clicks, no rubs, no murmurs ABD:  Flat, positive bowel sounds normal in frequency in pitch, no bruits, no rebound, no guarding, no midline pulsatile mass,  no hepatomegaly, no splenomegaly, umbilical hernia EXT:  2 plus pulses throughout, no edema, no cyanosis no clubbing    EKG:  EKG is  ordered today. Sinus rhythm, rate 76, axis within normal limits, intervals within normal limits, old inferior infarct, QRS prolongation from previous is no longer present.   CARDIAC CATH:     Total occlusion of the mid right coronary without significant collaterals. TIMI grade 1 antegrade flow. Patient is asymptomatic. Intervention is not indicated.  Mid eccentric 70% LAD stenosis performing a Medina 010 with the first diagonal.  Inferior akinesis. EF 55-60%. Normal LV end-diastolic pressure.   Recent Labs: No results found for requested labs within last 8760 hours.    Lipid Panel    Component Value  Date/Time   CHOL 151 10/07/2016 0112   TRIG 81 10/07/2016 0112   HDL 50 10/07/2016 0112   CHOLHDL 3.0 10/07/2016 0112   VLDL 16 10/07/2016 0112   LDLCALC 85 10/07/2016 0112      Wt Readings from Last 3 Encounters:  11/15/18 210 lb 6.4 oz (95.4 kg)  10/31/17 210 lb (95.3 kg)  12/15/16 196 lb (88.9 kg)      Other studies Reviewed: Additional studies/ records that were reviewed today include:  Labs Review of the above records demonstrates:  NA   ASSESSMENT AND PLAN:   CAD:    The patient has no new sypmtoms.  No further cardiovascular testing is indicated.  We will continue with aggressive risk reduction and meds as listed.  We had manages RCA medically and he is employing aggressive risk reduction for management of his other disease.  We had a long discussion about this.  In the absence of symptoms I am not can do further testing at this point.   TOBACCO ABUSE:   He has given up smoking for good.  DYSLIPIDEMIA:   I called his primary provider and he did not have a fasting lipid profile this year so we will have him go down to do this.  He understands the goal is an LDL less than 70.   Current medicines are reviewed at length with the patient today.  The patient does not have concerns regarding medicines.  The following changes have been made:  None  Labs/ tests ordered today include:  BMET to be done by PCP  Orders Placed This Encounter  Procedures  . Lipid panel  . EKG 12-Lead     Disposition:   FU with me in 12 months. Forest Becker, MD  11/15/2018 5:01 PM    [facility]

## 2018-11-15 ENCOUNTER — Ambulatory Visit: Payer: Commercial Managed Care - PPO | Admitting: Cardiology

## 2018-11-15 ENCOUNTER — Other Ambulatory Visit: Payer: Self-pay

## 2018-11-15 ENCOUNTER — Encounter: Payer: Self-pay | Admitting: Cardiology

## 2018-11-15 VITALS — BP 134/86 | HR 76 | Temp 97.7°F | Ht 69.0 in | Wt 210.4 lb

## 2018-11-15 DIAGNOSIS — Z720 Tobacco use: Secondary | ICD-10-CM | POA: Diagnosis not present

## 2018-11-15 DIAGNOSIS — I2510 Atherosclerotic heart disease of native coronary artery without angina pectoris: Secondary | ICD-10-CM | POA: Diagnosis not present

## 2018-11-15 DIAGNOSIS — E785 Hyperlipidemia, unspecified: Secondary | ICD-10-CM | POA: Diagnosis not present

## 2018-11-15 MED ORDER — NITROGLYCERIN 0.4 MG SL SUBL: 0.4000 mg | SUBLINGUAL_TABLET | SUBLINGUAL | 3 refills | Status: DC | PRN

## 2018-11-15 MED ORDER — ATORVASTATIN CALCIUM 80 MG PO TABS: 80.0000 mg | ORAL_TABLET | Freq: Every day | ORAL | 11 refills | Status: DC

## 2018-11-15 MED ORDER — METOPROLOL SUCCINATE ER 25 MG PO TB24: 25.0000 mg | ORAL_TABLET | Freq: Every day | ORAL | 11 refills | Status: DC

## 2018-11-15 NOTE — Patient Instructions (Signed)
Medication Instructions:  Your physician recommends that you continue on your current medications as directed. Please refer to the Current Medication list given to you today.  *If you need a refill on your cardiac medications before your next appointment, please call your pharmacy*  Lab Work: You will need to have labs (blood work) drawn:  Fasting Lipid Panel-DO NOT EAT OR DRINK PAST MIDNIGHT If you have labs (blood work) drawn today and your tests are completely normal, you will receive your results only by: Marland Kitchen MyChart Message (if you have MyChart) OR . A paper copy in the mail If you have any lab test that is abnormal or we need to change your treatment, we will call you to review the results.  Testing/Procedures: NONE ordered at this time of appointment   Follow-Up: At [facility], you and your health needs are our priority.  As part of our continuing mission to provide you with exceptional heart care, we have created designated Provider Care Teams.  These Care Teams include your primary Cardiologist (physician) and Advanced Practice Providers (APPs -  Physician Assistants and Nurse Practitioners) who all work together to provide you with the care you need, when you need it.  Your next appointment:   12 months  The format for your next appointment:   In Person  Provider:   You may see Minus Breeding, MD or one of the following Advanced Practice Providers on your designated Care Team:    Rosaria Ferries, PA-C  Jory Sims, DNP, ANP  Cadence Kathlen Mody, NP  Other Instructions

## 2018-11-16 ENCOUNTER — Ambulatory Visit: Payer: Commercial Managed Care - PPO | Admitting: Cardiology

## 2019-09-04 IMAGING — CT CT CHEST LUNG CANCER SCREENING LOW DOSE W/O CM
1 of 2 series · 15 of 32 positions shown, 19 images · non-contrast
Comparison: None.

CLINICAL DATA: 57-year-old male former smoker, quit 1 year ago,
with 30 pack-year history of smoking, for initial lung cancer
screening

EXAM:
CT CHEST WITHOUT CONTRAST LOW-DOSE FOR LUNG CANCER SCREENING
TECHNIQUE: Multidetector CT imaging of the chest was performed following the
standard protocol without IV contrast.

[Series 5: ldct screen lung · axial · 0.76mm/px · z∈[-380,-52]mm · 15 of 362 slices shown, 19 images]
[im 17/362  mediastinal]
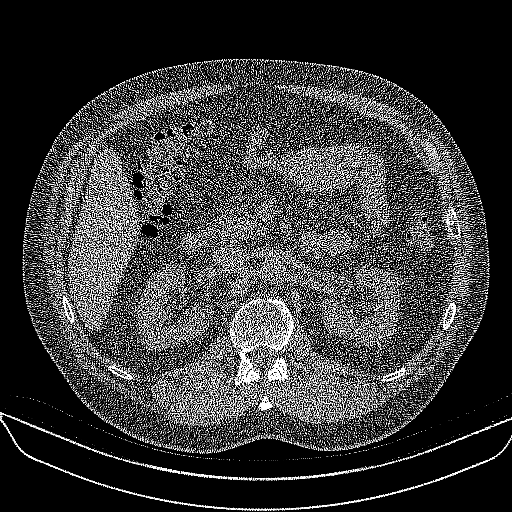
[im 17/362  lung]
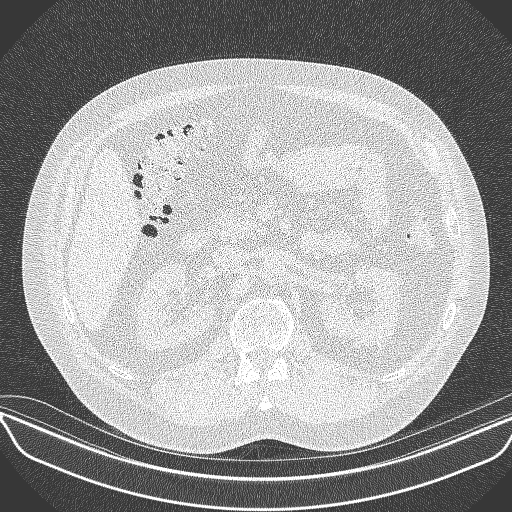
[im 50/362  lung]
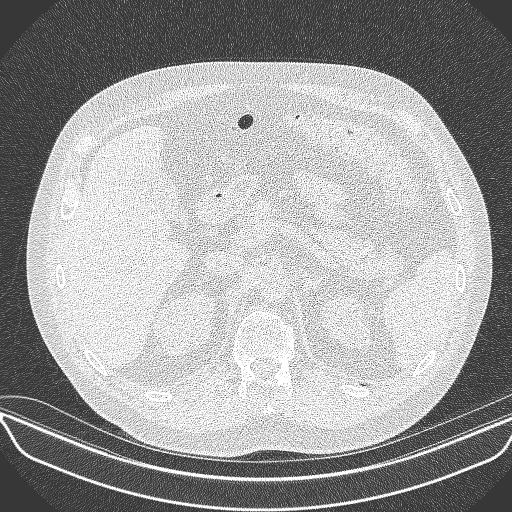
[im 83/362  lung]
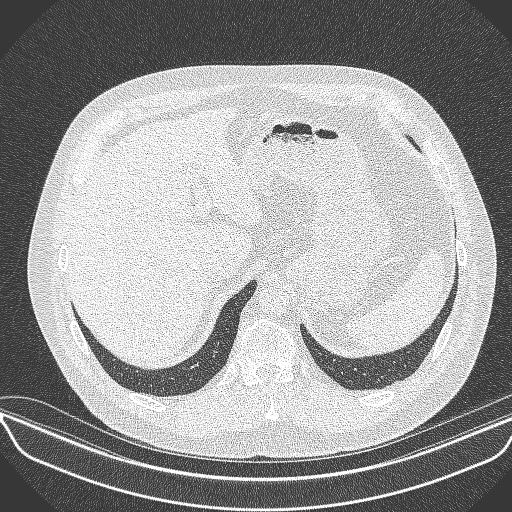
[im 91/362  lung]
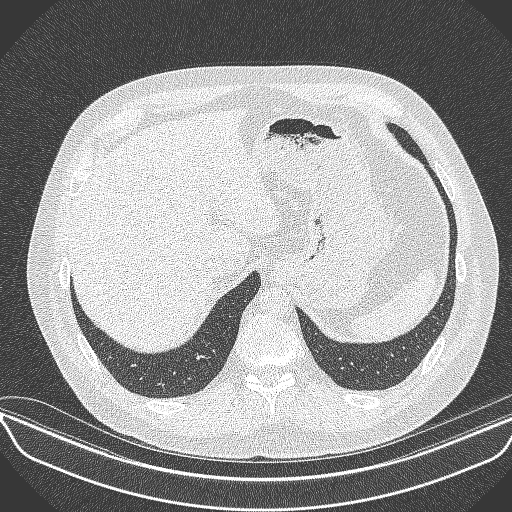
[im 115/362  mediastinal]
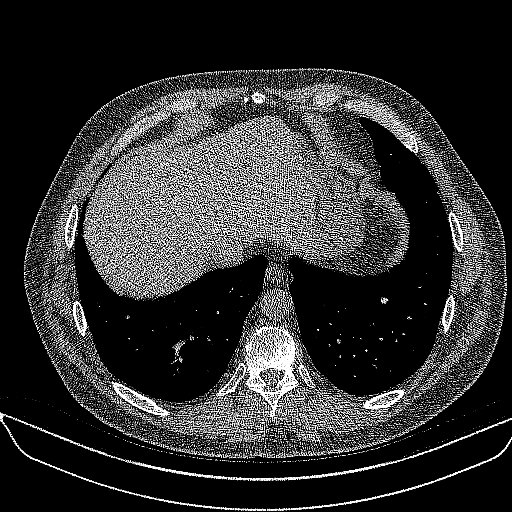
[im 115/362  lung]
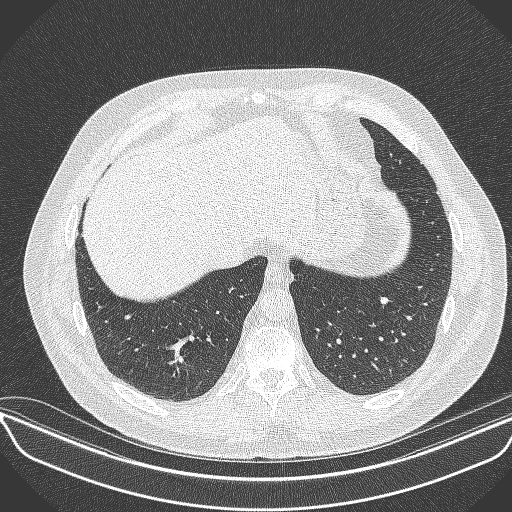
[im 132/362  lung]
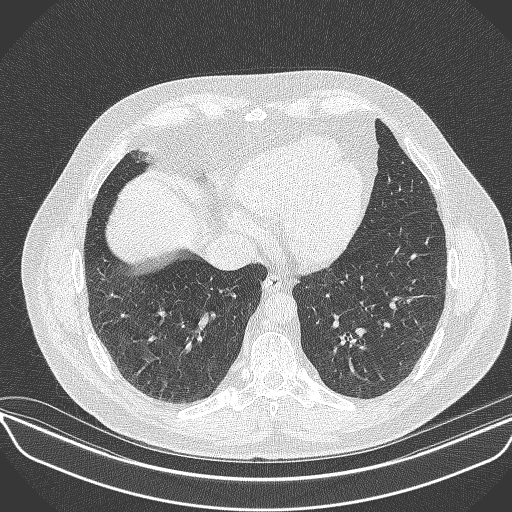
[im 165/362  lung]
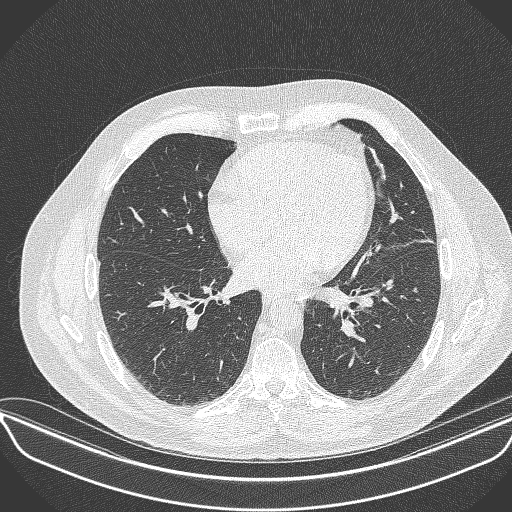
[im 181/362  lung]
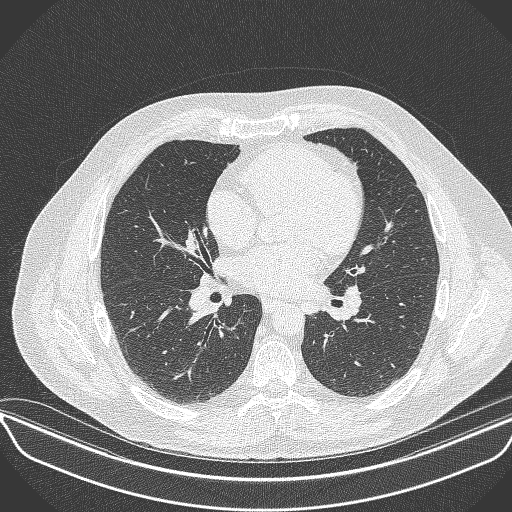
[im 197/362  mediastinal]
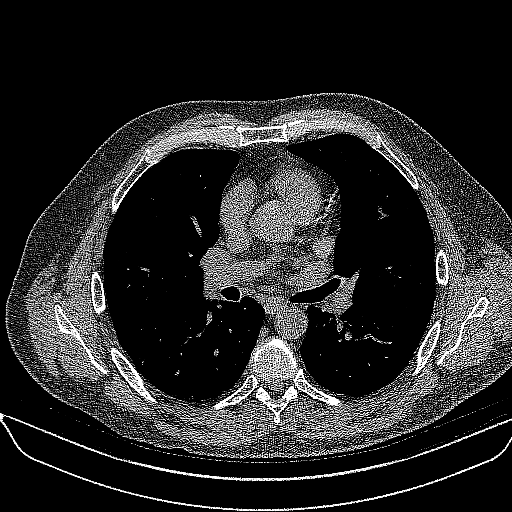
[im 197/362  lung]
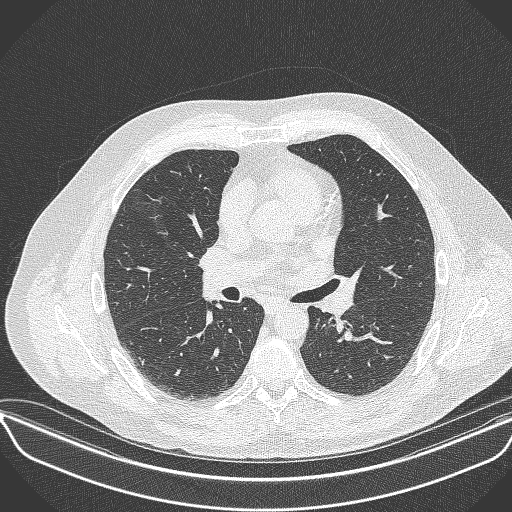
[im 230/362  lung]
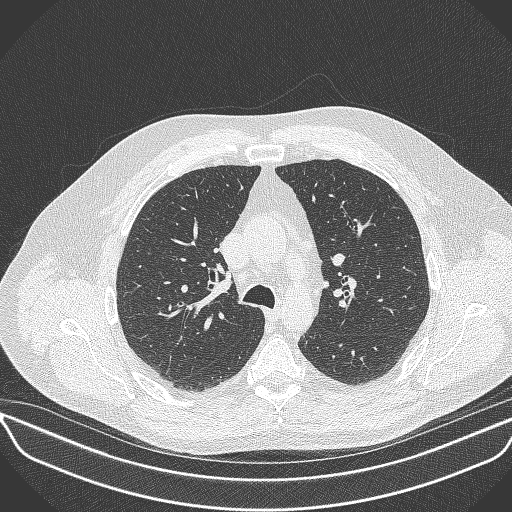
[im 247/362  lung]
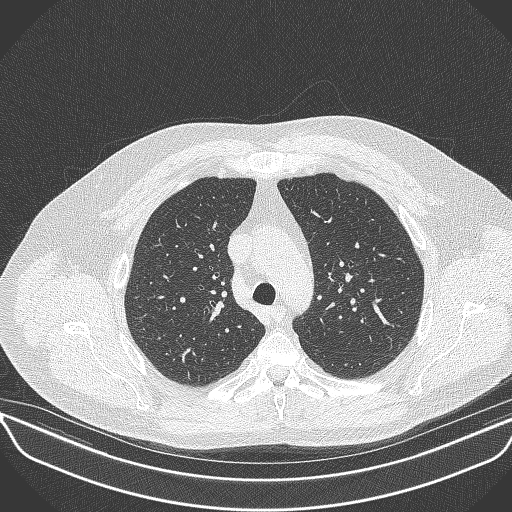
[im 271/362  lung]
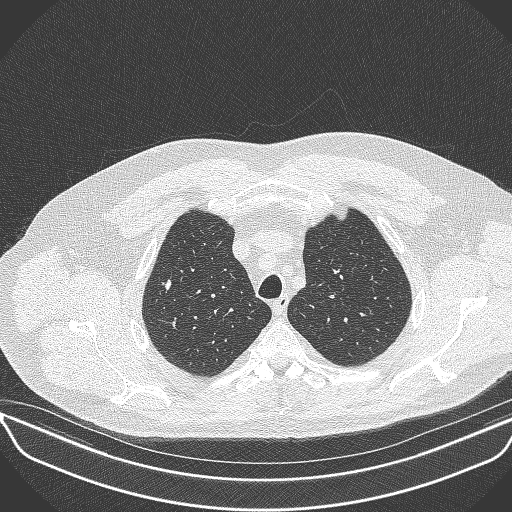
[im 296/362  mediastinal]
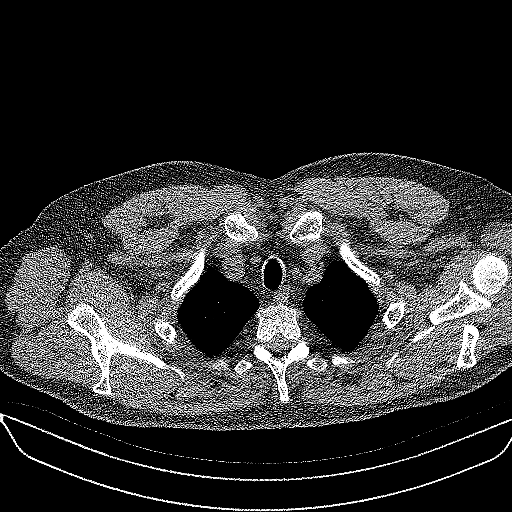
[im 296/362  lung]
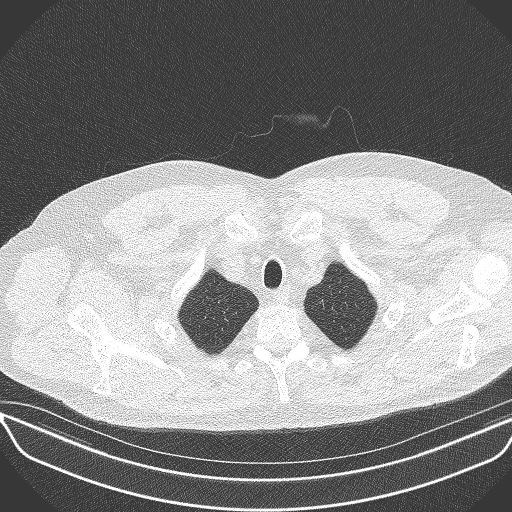
[im 312/362  lung]
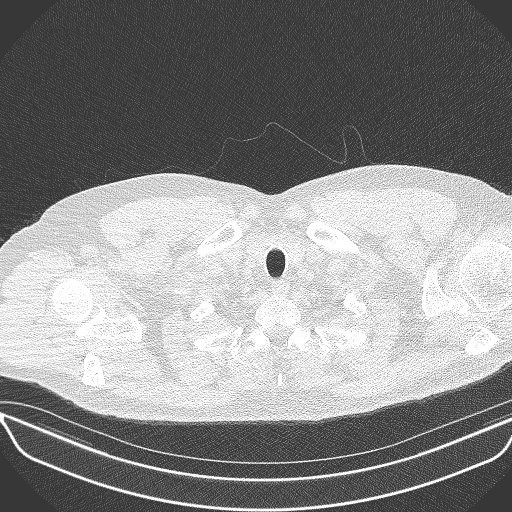
[im 345/362  lung]
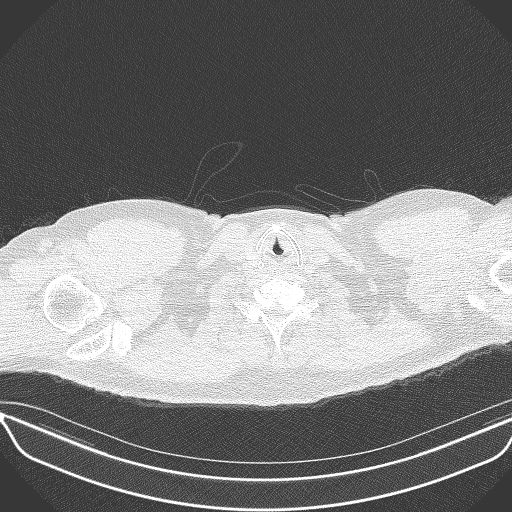

[15 of 32 positions shown; findings below may reference images not displayed]

FINDINGS: Cardiovascular: Heart is normal in size.  No pericardial effusion.

No evidence of thoracic aortic aneurysm. Mild atherosclerotic
calcifications of the aortic arch.

Three vessel cord atherosclerosis.

Mediastinum/Nodes: No suspicious mediastinal lymphadenopathy.

Visualized thyroid is unremarkable.

Lungs/Pleura: Mild linear scarring in the left upper lobe/lingula.

No focal consolidation.

Calcified granulomata in the right upper lobe, benign. Additional
calcified granuloma in the posterior left upper lobe, benign.

Dominant 10.6 mm nodule at the left lung base (image 294).
Additional left lower lobe nodules measuring up to 6.8 mm.

No pleural effusion or pneumothorax.

Upper Abdomen: Visualized upper abdomen is grossly unremarkable.

Musculoskeletal: Mild degenerative changes of the visualized
thoracolumbar spine.
IMPRESSION: Lung-RADS 4A, suspicious. Follow up low-dose chest CT without
contrast in 3 months (please use the following order, "CT CHEST LCS
NODULE FOLLOW-UP W/O CM") is recommended.

Dominant 10.6 mm nodule at the left lung base.

These results will be called to the ordering clinician or
representative by the Radiologist Assistant, and communication
documented in the PACS or zVision Dashboard.

Aortic Atherosclerosis ([id number]).

## 2019-11-14 NOTE — Progress Notes (Addendum)
Cardiology Office Note   Date:  11/15/2019   ID:  Raymond Bowers, DOB [birth year], MRN [id number]  PCP:  Kaleen Mask, MD  Cardiologist:   Rollene Rotunda, MD    Chief Complaint  Patient presents with  . Coronary Artery Disease      History of Present Illness: Raymond Bowers is a 59 y.o. male who presents for follow up after cardiac cath and late presentation for an inferior MI.  He presented with elevated enzymes 20 hours after his symptoms started.  He had a cardiac cath with disease as described below. On a Lexiscan Myoview he had no ischemia in the distribution of the LAD lesion.  Since I last saw him he has done very well.  He stays active with a job that requires him to walk.  He is a Emergency planning/management officer. The patient denies any new symptoms such as chest discomfort, neck or arm discomfort. There has been no new shortness of breath, PND or orthopnea. There have been no reported palpitations, presyncope or syncope.    Past Medical History:  Diagnosis Date  . Dyslipidemia   . 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 Tobacco dependence     Past Surgical History:  Procedure Laterality Date  . LEFT HEART CATH AND CORONARY ANGIOGRAPHY N/A 10/07/2016   Procedure: LEFT HEART CATH AND CORONARY ANGIOGRAPHY;  Surgeon: Lyn Records, MD;  Location: [facility] INVASIVE CV LAB;  Service: Cardiovascular;  Laterality: N/A;  . TONSILLECTOMY    . ULTRASOUND GUIDANCE FOR VASCULAR ACCESS  10/07/2016   Procedure: Ultrasound Guidance For Vascular Access;  Surgeon: Lyn Records, MD;  Location: [facility] INVASIVE CV LAB;  Service: Cardiovascular;;  . WRIST SURGERY       Current Outpatient Medications  Medication Sig Dispense Refill  . aspirin EC 81 MG tablet Take 81 mg by mouth daily.    Marland Kitchen atorvastatin (LIPITOR) 80 MG tablet Take 1 tablet (80 mg total) by mouth daily at 6 PM. Keep November appointment 30 tablet 11  . nitroGLYCERIN (NITROSTAT) 0.4 MG SL tablet Place 1 tablet  (0.4 mg total) under the tongue every 5 (five) minutes x 3 doses as needed for chest pain. 25 tablet 3   No current facility-administered medications for this visit.    Allergies:   Patient has no known allergies.    ROS:  Please see the history of present illness.   Otherwise, review of systems are positive for none.   All other systems are reviewed and negative.    PHYSICAL EXAM: VS:  BP 123/78   Pulse 65   Ht 5\' 9"  (1.753 m)   Wt 213 lb 3.2 oz (96.7 kg)   SpO2 97%   BMI 31.48 kg/m  , BMI Body mass index is 31.48 kg/m.  GENERAL:  Well appearing NECK:  No jugular venous distention, waveform within normal limits, carotid upstroke brisk and symmetric, no bruits, no thyromegaly LUNGS:  Clear to auscultation bilaterally CHEST:  Unremarkable HEART:  PMI not displaced or sustained,S1 and S2 within normal limits, no S3, no S4, no clicks, no rubs, no murmurs ABD:  Flat, positive bowel sounds normal in frequency in pitch, no bruits, no rebound, no guarding, no midline pulsatile mass, no hepatomegaly, no splenomegaly EXT:  2 plus pulses throughout, no edema, no cyanosis no clubbing      EKG:  EKG is  ordered today. Sinus rhythm, rate 65, axis within normal limits, intervals  within normal limits, old inferior infarct   CARDIAC CATH:     Total occlusion of the mid right coronary without significant collaterals. TIMI grade 1 antegrade flow. Patient is asymptomatic. Intervention is not indicated.  Mid eccentric 70% LAD stenosis performing a Medina 010 with the first diagonal.  Inferior akinesis. EF 55-60%. Normal LV end-diastolic pressure.   Recent Labs: No results found for requested labs within last 8760 hours.    Lipid Panel    Component Value Date/Time   CHOL 151 10/07/2016 0112   TRIG 81 10/07/2016 0112   HDL 50 10/07/2016 0112   CHOLHDL 3.0 10/07/2016 0112   VLDL 16 10/07/2016 0112   LDLCALC 85 10/07/2016 0112      Wt Readings from Last 3 Encounters:  11/15/19  213 lb 3.2 oz (96.7 kg)  11/15/18 210 lb 6.4 oz (95.4 kg)  10/31/17 210 lb (95.3 kg)      Other studies Reviewed: Additional studies/ records that were reviewed today include:  Labs Review of the above records demonstrates:  See elsewhere   ASSESSMENT AND PLAN:   CAD:    The patient has no new sypmtoms.  No further cardiovascular testing is indicated.  We will continue with aggressive risk reduction and meds as listed.  He and I have had a long conversation about the fact that I am not can to screen him with treadmill testing and I do not think nuclear testing is indicated for screening but that we need to have a low threshold to respond to any symptoms in the future and he needs to participate very aggressively on risk reduction.  TOBACCO ABUSE:   He quit in 2018.  DYSLIPIDEMIA:     I will get his blood work from his primary care physician today.  If his LDL is not less than 70 I am going to switch him to Crestor 40.  He and I had a conversation.  We talked about diet and exercise.  ADDENDUM: I did receive his blood work and his LDL was 83.  Total was 148 with an HDL of 43.  I would like to switch him to Crestor 40 mg daily stopping the Lipitor and repeat a liver profile in 10 weeks.  Current medicines are reviewed at length with the patient today.  The patient does not have concerns regarding medicines.  The following changes have been made: I will  Labs/ tests ordered today include:  None  Orders Placed This Encounter  Procedures  . EKG 12-Lead     Disposition:   FU with me in 12  months. Forest Becker, MD  11/15/2019 10:27 AM    [facility]

## 2019-11-15 ENCOUNTER — Ambulatory Visit: Payer: Commercial Managed Care - PPO | Admitting: Cardiology

## 2019-11-15 ENCOUNTER — Encounter: Payer: Self-pay | Admitting: Cardiology

## 2019-11-15 ENCOUNTER — Telehealth: Payer: Self-pay

## 2019-11-15 ENCOUNTER — Other Ambulatory Visit: Payer: Self-pay

## 2019-11-15 VITALS — BP 123/78 | HR 65 | Ht 69.0 in | Wt 213.2 lb

## 2019-11-15 DIAGNOSIS — E785 Hyperlipidemia, unspecified: Secondary | ICD-10-CM | POA: Diagnosis not present

## 2019-11-15 DIAGNOSIS — I2510 Atherosclerotic heart disease of native coronary artery without angina pectoris: Secondary | ICD-10-CM

## 2019-11-15 DIAGNOSIS — Z720 Tobacco use: Secondary | ICD-10-CM | POA: Diagnosis not present

## 2019-11-15 MED ORDER — ROSUVASTATIN CALCIUM 40 MG PO TABS: 40.0000 mg | ORAL_TABLET | Freq: Every day | ORAL | 3 refills | Status: DC

## 2019-11-15 NOTE — Telephone Encounter (Signed)
Spoke to pt to inform him of discontinuation of atorvastatin in favor of starting Crestor 40 mg once a day. Orders for lipid and liver labs placed, requisitions to be mailed to pt for completion in approximately 10 weeks. Pt verbalizes understanding and agrees with plan.

## 2019-11-15 NOTE — Patient Instructions (Signed)
Medication Instructions:  No changes *If you need a refill on your cardiac medications before your next appointment, please call your pharmacy*   Lab Work: We will contact your PCP's office for your most recent blood work If you have labs (blood work) drawn today and your tests are completely normal, you will receive your results only by:  MyChart Message (if you have MyChart) OR  A paper copy in the mail If you have any lab test that is abnormal or we need to change your treatment, we will call you to review the results.   Testing/Procedures: None ordered   Follow-Up: At [facility], you and your health needs are our priority.  As part of our continuing mission to provide you with exceptional heart care, we have created designated Provider Care Teams.  These Care Teams include your primary Cardiologist (physician) and Advanced Practice Providers (APPs -  Physician Assistants and Nurse Practitioners) who all work together to provide you with the care you need, when you need it.  We recommend signing up for the patient portal called "MyChart".  Sign up information is provided on this After Visit Summary.  MyChart is used to connect with patients for Virtual Visits (Telemedicine).  Patients are able to view lab/test results, encounter notes, upcoming appointments, etc.  Non-urgent messages can be sent to your provider as well.   To learn more about what you can do with MyChart, go to [email or web address].    Your next appointment:   12 month(s)  The format for your next appointment:   In Person  Provider:   Rollene Rotunda, MD   Other Instructions None

## 2019-11-18 ENCOUNTER — Telehealth: Payer: Self-pay | Admitting: Cardiology

## 2019-11-18 MED ORDER — METOPROLOL SUCCINATE ER 25 MG PO TB24: 25.0000 mg | ORAL_TABLET | Freq: Every day | ORAL | 3 refills | Status: DC

## 2019-11-18 NOTE — Telephone Encounter (Signed)
Called pt to confirm that he is currently taking metoprolol succinate 25 mg po daily. Refill request sent to pt's pharmacy. Medication list updated. Pt verbalizes understanding.

## 2019-11-18 NOTE — Telephone Encounter (Signed)
*  STAT* If patient is at the pharmacy, call can be transferred to refill team.   1. Which medications need to be refilled? (please list name of each medication and dose if known) Metoprolol succinate (TOPROL-XL) 25 mg   2. Which pharmacy/location (including street and city if local pharmacy) is medication to be sent to? [facility] - [place], [place] - [address]  3. Do they need a 30 day or 90 day supply? 30 days    Patient had appointment with Dr. Antoine Poche on 11/15/19, the same day this prescription expired. Patient states he does not recall Dr. Antoine Poche saying that he could/should stop taking this medication. Please call if necessary to advice. Patient has about one week left of supply.

## 2020-01-01 ENCOUNTER — Other Ambulatory Visit: Payer: Self-pay | Admitting: Family Medicine

## 2020-01-01 DIAGNOSIS — F17200 Nicotine dependence, unspecified, uncomplicated: Secondary | ICD-10-CM

## 2020-09-04 ENCOUNTER — Other Ambulatory Visit: Payer: Self-pay | Admitting: Family Medicine

## 2020-09-04 DIAGNOSIS — Z122 Encounter for screening for malignant neoplasm of respiratory organs: Secondary | ICD-10-CM

## 2020-09-04 DIAGNOSIS — F17200 Nicotine dependence, unspecified, uncomplicated: Secondary | ICD-10-CM

## 2020-09-30 ENCOUNTER — Ambulatory Visit: Payer: Commercial Managed Care - PPO

## 2020-10-06 ENCOUNTER — Other Ambulatory Visit: Payer: Self-pay | Admitting: *Deleted

## 2020-10-07 ENCOUNTER — Telehealth: Payer: Self-pay | Admitting: Cardiology

## 2020-10-07 DIAGNOSIS — E785 Hyperlipidemia, unspecified: Secondary | ICD-10-CM

## 2020-10-07 MED ORDER — ROSUVASTATIN CALCIUM 40 MG PO TABS: 40.0000 mg | ORAL_TABLET | Freq: Every day | ORAL | 0 refills | Status: DC

## 2020-10-07 NOTE — Telephone Encounter (Signed)
Refills has been sent to the pharmacy. 

## 2020-10-07 NOTE — Telephone Encounter (Signed)
 *  STAT* If patient is at the pharmacy, call can be transferred to refill team.   1. Which medications need to be refilled? (please list name of each medication and dose if known)   rosuvastatin (CRESTOR) 40 MG tablet     2. Which pharmacy/location (including street and city if local pharmacy) is medication to be sent to? [facility] - [place], [place] - [address]  3. Do they need a 30 day or 90 day supply? 90 days

## 2020-10-23 ENCOUNTER — Ambulatory Visit
Admission: RE | Admit: 2020-10-23 | Discharge: 2020-10-23 | Disposition: A | Discharge status: Home / Self Care | Payer: Commercial Managed Care - PPO | Source: Ambulatory Visit | Attending: Family Medicine | Admitting: Family Medicine

## 2020-10-23 DIAGNOSIS — Z122 Encounter for screening for malignant neoplasm of respiratory organs: Secondary | ICD-10-CM

## 2020-10-23 DIAGNOSIS — F17200 Nicotine dependence, unspecified, uncomplicated: Secondary | ICD-10-CM

## 2020-10-31 ENCOUNTER — Other Ambulatory Visit: Payer: Self-pay | Admitting: Cardiology

## 2020-11-26 NOTE — Progress Notes (Addendum)
Cardiology Office Note   Date:  11/27/2020   ID:  Raymond Bowers, DOB 1960-11-13, MRN [id number]  PCP:  Kaleen Mask, MD  Cardiologist:   Rollene Rotunda, MD    Chief Complaint  Patient presents with   Coronary Artery Disease      History of Present Illness: Raymond Bowers is a 60 y.o. male who presents for follow up after cardiac cath and late presentation for an inferior MI.  He has done well since I saw him. The patient denies any new symptoms such as chest discomfort, neck or arm discomfort. There has been no new shortness of breath, PND or orthopnea. There have been no reported palpitations, presyncope or syncope.    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   LHC 10/07/16 with CTO RCA, 70% mid LAD   Tobacco dependence     Past Surgical History:  Procedure Laterality Date   LEFT HEART CATH AND CORONARY ANGIOGRAPHY N/A 10/07/2016   Procedure: LEFT HEART CATH AND CORONARY ANGIOGRAPHY;  Surgeon: Lyn Records, MD;  Location: [facility] INVASIVE CV LAB;  Service: Cardiovascular;  Laterality: N/A;   TONSILLECTOMY     ULTRASOUND GUIDANCE FOR VASCULAR ACCESS  10/07/2016   Procedure: Ultrasound Guidance For Vascular Access;  Surgeon: Lyn Records, MD;  Location: [facility] INVASIVE CV LAB;  Service: Cardiovascular;;   WRIST SURGERY       Current Outpatient Medications  Medication Sig Dispense Refill   aspirin EC 81 MG tablet Take 81 mg by mouth daily.     metoprolol succinate (TOPROL-XL) 25 MG 24 hr tablet Take 1 tablet by mouth once daily 30 tablet 0   nitroGLYCERIN (NITROSTAT) 0.4 MG SL tablet Place 1 tablet (0.4 mg total) under the tongue every 5 (five) minutes x 3 doses as needed for chest pain. 25 tablet 3   rosuvastatin (CRESTOR) 40 MG tablet Take 1 tablet (40 mg total) by mouth daily. 90 tablet 0   No current facility-administered medications for this visit.    Allergies:   Patient has no known allergies.    ROS:  Please see  the history of present illness.   Otherwise, review of systems are positive for none.   All other systems are reviewed and negative.    PHYSICAL EXAM: VS:  BP 132/82   Pulse 70   Ht 5\' 9"  (1.753 m)   Wt 214 lb 9.6 oz (97.3 kg)   SpO2 99%   BMI 31.69 kg/m  , BMI Body mass index is 31.69 kg/m.  GENERAL:  Well appearing NECK:  No jugular venous distention, waveform within normal limits, carotid upstroke brisk and symmetric, no bruits, no thyromegaly LUNGS:  Clear to auscultation bilaterally CHEST:  Unremarkable HEART:  PMI not displaced or sustained,S1 and S2 within normal limits, no S3, no S4, no clicks, no rubs, no murmurs ABD:  Flat, positive bowel sounds normal in frequency in pitch, no bruits, no rebound, no guarding, no midline pulsatile mass, no hepatomegaly, no splenomegaly EXT:  2 plus pulses throughout, no edema, no cyanosis no clubbing   EKG:  EKG is  ordered today. Sinus rhythm, rate 72, axis within normal limits, intervals within normal limits, old inferior infarct   CARDIAC CATH:    Total occlusion of the mid right coronary without significant collaterals. TIMI grade 1 antegrade flow. Patient is asymptomatic. Intervention is not indicated. Mid eccentric 70% LAD stenosis performing a Medina 010 with the first  diagonal. Inferior akinesis. EF 55-60%. Normal LV end-diastolic pressure.   Recent Labs: No results found for requested labs within last 8760 hours.    Lipid Panel    Component Value Date/Time   CHOL 151 10/07/2016 0112   TRIG 81 10/07/2016 0112   HDL 50 10/07/2016 0112   CHOLHDL 3.0 10/07/2016 0112   VLDL 16 10/07/2016 0112   LDLCALC 85 10/07/2016 0112      Wt Readings from Last 3 Encounters:  11/27/20 214 lb 9.6 oz (97.3 kg)  11/15/19 213 lb 3.2 oz (96.7 kg)  11/15/18 210 lb 6.4 oz (95.4 kg)      Other studies Reviewed: Additional studies/ records that were reviewed today include:  None Review of the above records demonstrates:  See  elsewhere   ASSESSMENT AND PLAN:   CAD:  The patient has no new sypmtoms.  No further cardiovascular testing is indicated.  We will continue with aggressive risk reduction and meds as listed.  TOBACCO ABUSE:  He quit at the time of his MI.    DYSLIPIDEMIA:    His LDL was 83 last year.  I changed him to Crestor 40 mg daily and I will follow-up with the labs from his primary provider.  If he has not had a goal LDL less than 70 for closer to 50 I would likely add Zetia.  Current medicines are reviewed at length with the patient today.  The patient does not have concerns regarding medicines.  The following changes have been made:  None  Labs/ tests ordered today include:  None  No orders of the defined types were placed in this encounter.  Addendum:  LDL came back at 65.  No change in therapy.    Disposition:   FU with me in 18 months. Forest Becker, MD  11/27/2020 9:22 AM    [facility]

## 2020-11-27 ENCOUNTER — Encounter: Payer: Self-pay | Admitting: Cardiology

## 2020-11-27 ENCOUNTER — Other Ambulatory Visit: Payer: Self-pay

## 2020-11-27 ENCOUNTER — Ambulatory Visit: Payer: Commercial Managed Care - PPO | Admitting: Cardiology

## 2020-11-27 VITALS — BP 132/82 | HR 70 | Ht 69.0 in | Wt 214.6 lb

## 2020-11-27 DIAGNOSIS — I2510 Atherosclerotic heart disease of native coronary artery without angina pectoris: Secondary | ICD-10-CM

## 2020-11-27 DIAGNOSIS — E785 Hyperlipidemia, unspecified: Secondary | ICD-10-CM

## 2020-11-27 DIAGNOSIS — Z720 Tobacco use: Secondary | ICD-10-CM | POA: Diagnosis not present

## 2020-11-27 NOTE — Patient Instructions (Signed)
Medication Instructions:  Continue same medications *If you need a refill on your cardiac medications before your next appointment, please call your pharmacy*   Lab Work: None ordered   Testing/Procedures: None ordered   Follow-Up: At [facility], you and your health needs are our priority.  As part of our continuing mission to provide you with exceptional heart care, we have created designated Provider Care Teams.  These Care Teams include your primary Cardiologist (physician) and Advanced Practice Providers (APPs -  Physician Assistants and Nurse Practitioners) who all work together to provide you with the care you need, when you need it.  We recommend signing up for the patient portal called "MyChart".  Sign up information is provided on this After Visit Summary.  MyChart is used to connect with patients for Virtual Visits (Telemedicine).  Patients are able to view lab/test results, encounter notes, upcoming appointments, etc.  Non-urgent messages can be sent to your provider as well.   To learn more about what you can do with MyChart, go to [email or web address].      Your next appointment:  1 year    The format for your next appointment: Office   Provider:  Dr.Hochrein   

## 2020-12-02 ENCOUNTER — Other Ambulatory Visit: Payer: Self-pay | Admitting: Cardiology

## 2021-01-01 ENCOUNTER — Other Ambulatory Visit: Payer: Self-pay | Admitting: Cardiology

## 2021-01-01 DIAGNOSIS — E785 Hyperlipidemia, unspecified: Secondary | ICD-10-CM

## 2021-01-06 ENCOUNTER — Telehealth: Payer: Self-pay | Admitting: Cardiology

## 2021-01-06 NOTE — Telephone Encounter (Signed)
*  STAT* If patient is at the pharmacy, call can be transferred to refill team.   1. Which medications need to be refilled? (please list name of each medication and dose if known)  nitroGLYCERIN (NITROSTAT) 0.4 MG SL tablet  2. Which pharmacy/location (including street and city if local pharmacy) is medication to be sent to? [facility] - [place], [place] - [address]  3. Do they need a 30 day or 90 day supply?   Standard emergency supply  Patient states he just realized his current prescription expired November, 2021. Please assist.

## 2021-01-08 MED ORDER — NITROGLYCERIN 0.4 MG SL SUBL: 0.4000 mg | SUBLINGUAL_TABLET | SUBLINGUAL | 3 refills | Status: DC | PRN

## 2021-01-08 NOTE — Telephone Encounter (Signed)
Sent 01/08/21 to [facility] on [address]

## 2021-01-30 ENCOUNTER — Telehealth: Payer: Self-pay | Admitting: Cardiology

## 2021-01-30 DIAGNOSIS — E785 Hyperlipidemia, unspecified: Secondary | ICD-10-CM

## 2021-02-03 ENCOUNTER — Other Ambulatory Visit: Payer: Self-pay

## 2021-02-03 DIAGNOSIS — E785 Hyperlipidemia, unspecified: Secondary | ICD-10-CM

## 2021-02-03 MED ORDER — ROSUVASTATIN CALCIUM 40 MG PO TABS: 40.0000 mg | ORAL_TABLET | Freq: Every day | ORAL | 3 refills | Status: DC

## 2021-02-03 NOTE — Telephone Encounter (Signed)
Refills for Rosuvastatin (Crestor) 40 MG 90 tablets with 3 refills sent to [facility] pharmacy on [address]

## 2021-02-03 NOTE — Telephone Encounter (Signed)
°*  STAT* If patient is at the pharmacy, call can be transferred to refill team.   1. Which medications need to be refilled? (please list name of each medication and dose if known) rosuvastatin (CRESTOR) 40 MG tablet  2. Which pharmacy/location (including street and city if local pharmacy) is medication to be sent to? [place], [place] [address]  3. Do they need a 30 day or 90 day supply? 30 day.  Patient would like to have 10 refills, to last him for his yearly check up which would last until his next yearly check up.

## 2021-08-27 ENCOUNTER — Other Ambulatory Visit: Payer: Self-pay | Admitting: Family Medicine

## 2021-08-27 DIAGNOSIS — F17200 Nicotine dependence, unspecified, uncomplicated: Secondary | ICD-10-CM

## 2021-09-22 LAB — COLOGUARD: COLOGUARD: NEGATIVE

## 2021-10-20 ENCOUNTER — Telehealth: Payer: Self-pay

## 2021-10-20 NOTE — Telephone Encounter (Signed)
Primary Cardiologist:James Hochrein, MD  Chart reviewed as part of pre-operative protocol coverage. Because of Raymond Bowers's past medical history and time since last visit, he/she will require a follow-up visit in order to better assess preoperative cardiovascular risk.  Pre-op covering staff: - Please schedule appointment and call patient to inform them. - Please contact requesting surgeon's office via preferred method (i.e, phone, fax) to inform them of need for appointment prior to surgery.  Request to hold aspirin for upcoming procedure. Pending no new symptoms of ACS, he is > 1 year post PCI and may hold aspirin for 5-7 days at the discretion of the surgeon.    Emmaline Life, NP-C  10/20/2021, 11:49 AM [address] Office [phone] Fax [phone]

## 2021-10-20 NOTE — Telephone Encounter (Signed)
Pt has appt 10/21/21 with Dr. Percival Spanish.

## 2021-10-20 NOTE — Progress Notes (Signed)
Cardiology Office Note   Date:  10/21/2021   ID:  Raymond Bowers, DOB Mar 01, 1960, MRN [id number]  PCP:  Kaleen Mask, MD  Cardiologist:   Rollene Rotunda, MD    Chief Complaint  Patient presents with   Coronary Artery Disease      History of Present Illness: Raymond Bowers is a 61 y.o. male who presents for follow up after cardiac cath and late presentation for an inferior MI.   Since I last saw him he has had no new cardiovascular complaints.  He is going to get umbilical hernia repair.  He is very active on his job.  He says he walks miles and that includes stairs and hills.  He gets his heart rate up.  The patient denies any new symptoms such as chest discomfort, neck or arm discomfort. There has been no new shortness of breath, PND or orthopnea. There have been no reported palpitations, presyncope or syncope.    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   LHC 10/07/16 with CTO RCA, 70% mid LAD   Tobacco dependence     Past Surgical History:  Procedure Laterality Date   LEFT HEART CATH AND CORONARY ANGIOGRAPHY N/A 10/07/2016   Procedure: LEFT HEART CATH AND CORONARY ANGIOGRAPHY;  Surgeon: Lyn Records, MD;  Location: [facility] INVASIVE CV LAB;  Service: Cardiovascular;  Laterality: N/A;   TONSILLECTOMY     ULTRASOUND GUIDANCE FOR VASCULAR ACCESS  10/07/2016   Procedure: Ultrasound Guidance For Vascular Access;  Surgeon: Lyn Records, MD;  Location: [facility] INVASIVE CV LAB;  Service: Cardiovascular;;   WRIST SURGERY       Current Outpatient Medications  Medication Sig Dispense Refill   aspirin EC 81 MG tablet Take 81 mg by mouth daily.     metoprolol succinate (TOPROL-XL) 25 MG 24 hr tablet Take 1 tablet by mouth once daily 90 tablet 3   nitroGLYCERIN (NITROSTAT) 0.4 MG SL tablet Place 1 tablet (0.4 mg total) under the tongue every 5 (five) minutes x 3 doses as needed for chest pain. 25 tablet 3   rosuvastatin (CRESTOR) 40 MG  tablet Take 1 tablet (40 mg total) by mouth daily. 90 tablet 3   metFORMIN (GLUCOPHAGE) 500 MG tablet Take 500 mg by mouth daily.     No current facility-administered medications for this visit.    Allergies:   Patient has no known allergies.    ROS:  Please see the history of present illness.   Otherwise, review of systems are positive for none.   All other systems are reviewed and negative.    PHYSICAL EXAM: VS:  BP 112/68   Pulse 70   Ht 5\' 8"  (1.727 m)   Wt 200 lb (90.7 kg)   SpO2 94%   BMI 30.41 kg/m  , BMI Body mass index is 30.41 kg/m.  GENERAL:  Well appearing NECK:  No jugular venous distention, waveform within normal limits, carotid upstroke brisk and symmetric, no bruits, no thyromegaly LUNGS:  Clear to auscultation bilaterally CHEST:  Unremarkable HEART:  PMI not displaced or sustained,S1 and S2 within normal limits, no S3, no S4, no clicks, no rubs, no murmurs ABD:  Flat, positive bowel sounds normal in frequency in pitch, no bruits, no rebound, no guarding, no midline pulsatile mass, no hepatomegaly, no splenomegaly, positive umbilical hernia EXT:  2 plus pulses throughout, no edema, no cyanosis no clubbing   EKG:  EKG  is  ordered today. Sinus rhythm, rate 70, axis within normal limits, intervals within normal limits, old inferior infarct   CARDIAC CATH:    Total occlusion of the mid right coronary without significant collaterals. TIMI grade 1 antegrade flow. Patient is asymptomatic. Intervention is not indicated. Mid eccentric 70% LAD stenosis performing a Medina 010 with the first diagonal. Inferior akinesis. EF 55-60%. Normal LV end-diastolic pressure.   Recent Labs: No results found for requested labs within last 365 days.    Lipid Panel    Component Value Date/Time   CHOL 151 10/07/2016 0112   TRIG 81 10/07/2016 0112   HDL 50 10/07/2016 0112   CHOLHDL 3.0 10/07/2016 0112   VLDL 16 10/07/2016 0112   LDLCALC 85 10/07/2016 0112      Wt Readings  from Last 3 Encounters:  10/21/21 200 lb (90.7 kg)  11/27/20 214 lb 9.6 oz (97.3 kg)  11/15/19 213 lb 3.2 oz (96.7 kg)      Other studies Reviewed: Additional studies/ records that were reviewed today include:  Labs Review of the above records demonstrates:  See elsewhere   ASSESSMENT AND PLAN:   CAD:  The patient has no new sypmtoms.  No further cardiovascular testing is indicated.  We will continue with aggressive risk reduction and meds as listed.  DYSLIPIDEMIA:    His LDL was  65.  This was last year.  I am going to try to get the most recent labs that were drawn and I am also going to draw an LP(a).   PREOP:   His risk index is 6.6% for major cardiovascular events.  He and I discussed this.  He has a very high functional level.  He is not having any unstable symptoms or physical findings.  No further cardiovascular testing is indicated and he would be willing to accept the risk.   Current medicines are reviewed at length with the patient today.  The patient does not have concerns regarding medicines.  The following changes have been made:  None  Labs/ tests ordered today include:    Orders Placed This Encounter  Procedures   Lipoprotein A (LPA)   EKG 12-Lead   Disposition:   FU with me in 12 months. Ronnell Guadalajara, MD  10/21/2021 1:09 PM    [facility]

## 2021-10-20 NOTE — Telephone Encounter (Signed)
Left message for the pt that he needs to schedule an in office appt for pre op clearance. Pt can see Dr. Percival Spanish or APP

## 2021-10-20 NOTE — Telephone Encounter (Addendum)
   Pre-operative Risk Assessment    Patient Name: Dayvon Dax  DOB: Jun 04, 1960 MRN: [id number]      Request for Surgical Clearance    Procedure:   Lap assisted umbilical hernia repair  Date of Surgery:  Clearance TBD                                 Surgeon:  Johnathan Hausen MD Surgeon's Group or Practice Name:  [facility] Phone number:  [phone]  Fax number:  [phone]   Type of Clearance Requested:   - Pharmacy:  Hold Aspirin pre and post surgery   Type of Anesthesia:  General    Additional requests/questions:  Please fax a copy of instructions from you as to how the patient should HOLD medication preoperatively, please fax a note of Cardiac Clearance to [phone] attn Chermira Jones, CMA to the surgeon's office.  Signed, Jeanmarie Plant Wilman Tucker  CCMA 10/20/2021, 11:00 AM

## 2021-10-21 ENCOUNTER — Encounter: Payer: Self-pay | Admitting: Cardiology

## 2021-10-21 ENCOUNTER — Ambulatory Visit: Payer: Commercial Managed Care - PPO | Attending: Cardiology | Admitting: Cardiology

## 2021-10-21 DIAGNOSIS — I2510 Atherosclerotic heart disease of native coronary artery without angina pectoris: Secondary | ICD-10-CM

## 2021-10-21 DIAGNOSIS — E785 Hyperlipidemia, unspecified: Secondary | ICD-10-CM | POA: Diagnosis not present

## 2021-10-21 NOTE — Patient Instructions (Signed)
Medication Instructions:  Your physician recommends that you continue on your current medications as directed. Please refer to the Current Medication list given to you today.  *If you need a refill on your cardiac medications before your next appointment, please call your pharmacy*   Lab Work: Your physician recommends that you have the following lab drawn today: [facility] If you have labs (blood work) drawn today and your tests are completely normal, you will receive your results only by: [place] (if you have MyChart) OR A paper copy in the mail If you have any lab test that is abnormal or we need to change your treatment, we will call you to review the results.   Testing/Procedures: NONE ordered at this time of appointment   Follow-Up: At [facility], you and your health needs are our priority.  As part of our continuing mission to provide you with exceptional heart care, we have created designated Provider Care Teams.  These Care Teams include your primary Cardiologist (physician) and Advanced Practice Providers (APPs -  Physician Assistants and Nurse Practitioners) who all work together to provide you with the care you need, when you need it.  We recommend signing up for the patient portal called "MyChart".  Sign up information is provided on this After Visit Summary.  MyChart is used to connect with patients for Virtual Visits (Telemedicine).  Patients are able to view lab/test results, encounter notes, upcoming appointments, etc.  Non-urgent messages can be sent to your provider as well.   To learn more about what you can do with MyChart, go to [email or web address].    Your next appointment:   1 year(s)  The format for your next appointment:   In Person  Provider:   Minus Breeding, MD     Important Information About Sugar

## 2021-10-22 LAB — LIPOPROTEIN A (LPA): Lipoprotein (a): 44 nmol/L (ref <75.0)

## 2021-10-25 ENCOUNTER — Encounter: Payer: Self-pay | Admitting: *Deleted

## 2021-11-10 ENCOUNTER — Other Ambulatory Visit: Payer: Self-pay | Admitting: Cardiology

## 2021-12-20 NOTE — Progress Notes (Signed)
Surgery orders requested via Epic inbox. °

## 2021-12-23 NOTE — Patient Instructions (Addendum)
DUE TO COVID-19 ONLY TWO VISITORS  (aged 60 and older)  ARE ALLOWED TO COME WITH YOU AND STAY IN THE WAITING ROOM ONLY DURING PRE OP AND PROCEDURE.   **NO VISITORS ARE ALLOWED IN THE SHORT STAY AREA OR RECOVERY ROOM!!**  IF YOU WILL BE ADMITTED INTO THE HOSPITAL YOU ARE ALLOWED ONLY FOUR SUPPORT PEOPLE DURING VISITATION HOURS ONLY (7 AM -8PM)   The support person(s) must pass our screening, gel in and out, and wear a mask at all times, including in the patient's room. Patients must also wear a mask when staff or their support person are in the room. Visitors GUEST BADGE MUST BE WORN VISIBLY  One adult visitor may remain with you overnight and MUST be in the room by 8 P.M.     Your procedure is scheduled on: 01/07/22   Report to [facility] Main Entrance    Report to admitting at  5:15 AM   Call this number if you have problems the morning of surgery [phone]   Do not eat food :After Midnight.  You may have a clear liquid diet until 4:30 AM the morning of surgery  CLEAR LIQUID DIET  Water Black Coffee (sugar ok, NO MILK/CREAM OR CREAMERS)  Tea (sugar ok, NO MILK/CREAM OR CREAMERS) regular and decaf                             Plain Jell-O (NO RED)                                           Fruit ices (not with fruit pulp, NO RED)                                     Popsicles (NO RED)                                                                  Juice: apple, WHITE grape, WHITE cranberry Sports drinks like Gatorade (NO RED)      If you have questions, please contact your surgeon's office.       Oral Hygiene is also important to reduce your risk of infection.                                    Remember - BRUSH YOUR TEETH THE MORNING OF SURGERY WITH YOUR REGULAR TOOTHPASTE  DENTURES WILL BE REMOVED PRIOR TO SURGERY PLEASE DO NOT APPLY "Poly grip" OR ADHESIVES!!!   Do NOT smoke after Midnight   Take these medicines the morning of surgery with A SIP OF WATER:  Rosuvastatin-crestor  Metoprolol  DO NOT TAKE ANY ORAL DIABETIC MEDICATIONS DAY OF YOUR SURGERY(Metformin)   Before surgery.Stop taking __ASA 81_________on _12/22_________as instructed by _____________. .  Bring CPAP mask and tubing day of surgery.                              You may not have any metal on your body including  jewelry, and body piercing             Do not wear lotions, powders, cologne, or deodorant               Men may shave face and neck.   Do not bring valuables to the hospital. [place] IS NOT             RESPONSIBLE   FOR VALUABLES.   Contacts, glasses, or bridgework may not be worn into surgery.   DO NOT BRING YOUR HOME MEDICATIONS TO THE HOSPITAL     Patients discharged on the day of surgery will not be allowed to drive home.  Someone NEEDS to stay with you for the first 24 hours after anesthesia.                 Please read over the following fact sheets you were given: IF YOU HAVE QUESTIONS ABOUT YOUR PRE-OP INSTRUCTIONS PLEASE CALL [phone]    [facility] - Preparing for Surgery Before surgery, you can play an important role.  Because skin is not sterile, your skin needs to be as free of germs as possible.  You can reduce the number of germs on your skin by washing with CHG (chlorahexidine gluconate) soap before surgery.  CHG is an antiseptic cleaner which kills germs and bonds with the skin to continue killing germs even after washing. Please DO NOT use if you have an allergy to CHG or antibacterial soaps.  If your skin becomes reddened/irritated stop using the CHG and inform your nurse when you arrive at Short Stay.  You may shave your face/neck. Please follow these instructions carefully:  1.  Shower with CHG Soap the night before surgery and the  morning of Surgery.  2.  If you choose to wash your hair, wash your hair  first as usual with your  normal  shampoo.  3.  After you shampoo, rinse your hair and body thoroughly to remove the  shampoo.                            4.  Use CHG as you would any other liquid soap.  You can apply chg directly  to the skin and wash                       Gently with a scrungie or clean washcloth.  5.  Apply the CHG Soap to your body ONLY FROM THE NECK DOWN.   Do not use on face/ open                           Wound or open sores. Avoid contact with eyes, ears mouth and genitals (private parts).                       Wash face,  Genitals (private parts) with your normal soap.  6.  Wash thoroughly, paying special attention to the area where your surgery  will be performed.  7.  Thoroughly rinse your body with warm water from the neck down.  8.  DO NOT shower/wash with your normal soap after using and rinsing off  the CHG Soap.             9.  Pat yourself dry with a clean towel.            10.  Wear clean pajamas.            11.  Place clean sheets on your bed the night of your first shower and do not  sleep with pets. Day of Surgery : Do not apply any lotions/deodorants the morning of surgery.  Please wear clean clothes to the hospital/surgery center.  FAILURE TO FOLLOW THESE INSTRUCTIONS MAY RESULT IN THE CANCELLATION OF YOUR SURGERY    ________________________________________________________________________

## 2021-12-23 NOTE — Progress Notes (Signed)
Second request: Spoke with Tresa Endo at [facility] to request orders in [facility].

## 2021-12-27 ENCOUNTER — Ambulatory Visit: Payer: Self-pay | Admitting: Surgery

## 2021-12-27 ENCOUNTER — Encounter (HOSPITAL_COMMUNITY)
Admission: RE | Admit: 2021-12-27 | Discharge: 2021-12-27 | Disposition: A | Discharge status: Home / Self Care | Payer: Commercial Managed Care - PPO | Source: Ambulatory Visit | Attending: Surgery | Admitting: Surgery

## 2021-12-27 ENCOUNTER — Encounter (HOSPITAL_COMMUNITY): Payer: Self-pay

## 2021-12-27 ENCOUNTER — Other Ambulatory Visit: Payer: Self-pay

## 2021-12-27 VITALS — BP 141/80 | HR 74 | Temp 98.0°F | Resp 18 | Ht 68.0 in | Wt 213.0 lb

## 2021-12-27 DIAGNOSIS — E119 Type 2 diabetes mellitus without complications: Secondary | ICD-10-CM

## 2021-12-27 DIAGNOSIS — I2729 Other secondary pulmonary hypertension: Secondary | ICD-10-CM

## 2021-12-27 DIAGNOSIS — Z01812 Encounter for preprocedural laboratory examination: Secondary | ICD-10-CM | POA: Diagnosis present

## 2021-12-27 HISTORY — DX: Prediabetes: R73.03

## 2021-12-27 HISTORY — DX: Atherosclerotic heart disease of native coronary artery without angina pectoris: I25.10

## 2021-12-27 LAB — CBC
HCT: 46.1 % (ref 39.0–52.0)
Hemoglobin: 15.5 g/dL (ref 13.0–17.0)
MCH: 32.4 pg (ref 26.0–34.0)
MCHC: 33.6 g/dL (ref 30.0–36.0)
MCV: 96.2 fL (ref 80.0–100.0)
Platelets: 202 10*3/uL (ref 150–400)
RBC: 4.79 MIL/uL (ref 4.22–5.81)
RDW: 11.9 % (ref 11.5–15.5)
WBC: 6 10*3/uL (ref 4.0–10.5)
nRBC: 0 % (ref 0.0–0.2)

## 2021-12-27 LAB — BASIC METABOLIC PANEL
Anion gap: 7 (ref 5–15)
BUN: 13 mg/dL (ref 6–20)
CO2: 26 mmol/L (ref 22–32)
Calcium: 9.4 mg/dL (ref 8.9–10.3)
Chloride: 107 mmol/L (ref 98–111)
Creatinine, Ser: 1 mg/dL (ref 0.61–1.24)
GFR, Estimated: >60 mL/min (ref >60)
Glucose, Bld: 142 mg/dL — ABNORMAL HIGH (ref 70–99)
Potassium: 4.4 mmol/L (ref 3.5–5.1)
Sodium: 140 mmol/L (ref 135–145)

## 2021-12-27 LAB — GLUCOSE, CAPILLARY: Glucose-Capillary: 191 mg/dL — ABNORMAL HIGH (ref 70–99)

## 2021-12-27 NOTE — Progress Notes (Signed)
Anesthesia note:  Bowel prep reminder:  NA  PCP - Dr. Hart Carwin Cardiologist -Dr. Daiva Nakayama Other-   Chest x-ray - no EKG - 10/21/21-epic Stress Test - 2018 ECHO - no Cardiac Cath - 2018 no stent CABG-no Pacemaker/ICD device last checked:NA  Sleep Study - no CPAP -    CBG at PAT visit-191 Fasting Blood Sugar at home-90-110 Checks Blood Sugar BID_____  Blood Thinner:ASA 81 mg Blood Thinner Instructions: Aspirin Instructions:stop 5-7 days Last Dose:1222  Anesthesia review: Yes   reason:cardiac  Patient denies shortness of breath, fever, cough and chest pain at PAT appointment. Pt has no SOB with activities.    Patient verbalized understanding of instructions that were given to them at the PAT appointment. Patient was also instructed that they will need to review over the PAT instructions again at home before surgery.yes

## 2021-12-28 LAB — HEMOGLOBIN A1C
Hgb A1c MFr Bld: 5.7 % — ABNORMAL HIGH (ref 4.8–5.6)
Mean Plasma Glucose: 117 mg/dL

## 2022-01-05 NOTE — Progress Notes (Signed)
Anesthesia Chart Review   Case: [id number] Date/Time: 01/07/22 0715   Procedure: LAPAROSCOPIC ASSISTED UMBILICAL HERNIA REPAIRED WITH MESH   Anesthesia type: General   Pre-op diagnosis: LARGE UMBILICAL HERNIA   Location: [facility] ROOM 04 / [facility]   Surgeons: Luretha Murphy, MD       DISCUSSION:61 y.o. former smoker with h/o CAD, large umbilical hernia scheduled for above procedure 01/07/2022 with Dr. Luretha Murphy.   Pt last seen by cardiology 10/21/2021. Per OV note, "PREOP:   His risk index is 6.6% for major cardiovascular events.  He and I discussed this.  He has a very high functional level.  He is not having any unstable symptoms or physical findings.  No further cardiovascular testing is indicated and he would be willing to accept the risk."  Anticipate pt can proceed with planned procedure barring acute status change.   VS: BP (!) 141/80   Pulse 74   Temp 36.7 C (Oral)   Resp 18   Ht 5\' 8"  (1.727 m)   Wt 96.6 kg   SpO2 98%   BMI 32.39 kg/m   PROVIDERS: , MD is PCP   Kaleen Mask, MD is Cardiologist  LABS: Labs reviewed: Acceptable for surgery. (all labs ordered are listed, but only abnormal results are displayed)  Labs Reviewed  BASIC METABOLIC PANEL - Abnormal; Notable for the following components:      Result Value   Glucose, Bld 142 (*)    All other components within normal limits  GLUCOSE, CAPILLARY - Abnormal; Notable for the following components:   Glucose-Capillary 191 (*)    All other components within normal limits  HEMOGLOBIN A1C - Abnormal; Notable for the following components:   Hgb A1c MFr Bld 5.7 (*)    All other components within normal limits  CBC     IMAGES:   EKG:   CV:  Past Medical History:  Diagnosis Date   Coronary artery disease    Dyslipidemia    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   LHC 10/07/16 with CTO RCA, 70% mid LAD   Pre-diabetes     Past Surgical History:  Procedure Laterality Date   LEFT HEART CATH  AND CORONARY ANGIOGRAPHY N/A 10/07/2016   Procedure: LEFT HEART CATH AND CORONARY ANGIOGRAPHY;  Surgeon: 10/09/2016, MD;  Location: [facility] INVASIVE CV LAB;  Service: Cardiovascular;  Laterality: N/A;   TONSILLECTOMY     as a child   ULTRASOUND GUIDANCE FOR VASCULAR ACCESS  10/07/2016   Procedure: Ultrasound Guidance For Vascular Access;  Surgeon: 10/09/2016, MD;  Location: [facility] INVASIVE CV LAB;  Service: Cardiovascular;;   WRIST SURGERY Bilateral 2003    MEDICATIONS:  aspirin EC 81 MG tablet   metFORMIN (GLUCOPHAGE) 500 MG tablet   metoprolol succinate (TOPROL-XL) 25 MG 24 hr tablet   Multiple Vitamin (MULTIVITAMIN WITH MINERALS) TABS tablet   nitroGLYCERIN (NITROSTAT) 0.4 MG SL tablet   rosuvastatin (CRESTOR) 40 MG tablet   No current facility-administered medications for this encounter.   2004 Ward, PA-C [facility] Pre-Surgical Testing [phone]

## 2022-01-05 NOTE — Anesthesia Preprocedure Evaluation (Addendum)
Anesthesia Evaluation  Patient identified by MRN, date of birth, ID band Patient awake    Reviewed: Allergy & Precautions, H&P , NPO status , Patient's Chart, lab work & pertinent test results  Airway Mallampati: I  TM Distance: >3 FB Neck ROM: Full    Dental no notable dental hx. (+) Teeth Intact, Dental Advisory Given, Caps   Pulmonary neg pulmonary ROS, former smoker   Pulmonary exam normal breath sounds clear to auscultation       Cardiovascular Exercise Tolerance: Good hypertension, Pt. on medications and Pt. on home beta blockers + CAD and + Past MI  negative cardio ROS Normal cardiovascular exam Rhythm:Regular Rate:Normal  CATH 18 Total occlusion of the mid right coronary without significant collaterals. TIMI grade 1 antegrade flow. Patient is asymptomatic. Intervention is not indicated. Mid eccentric 70% LAD stenosis performing a [place] 010 with the first diagonal. Inferior akinesis. EF 55-60%. Normal LV end-diastolic pressure.   Neuro/Psych negative neurological ROS  negative psych ROS   GI/Hepatic negative GI ROS, Neg liver ROS,,,  Endo/Other  negative endocrine ROS    Renal/GU negative Renal ROS  negative genitourinary   Musculoskeletal negative musculoskeletal ROS (+)    Abdominal   Peds negative pediatric ROS (+)  Hematology negative hematology ROS (+)   Anesthesia Other Findings   Reproductive/Obstetrics negative OB ROS                             Anesthesia Physical Anesthesia Plan  ASA: 3  Anesthesia Plan: General   Post-op Pain Management: Tylenol PO (pre-op)* and Celebrex PO (pre-op)*   Induction: Intravenous  PONV Risk Score and Plan: 2 and Ondansetron, Dexamethasone and Treatment may vary due to age or medical condition  Airway Management Planned: Oral ETT  Additional Equipment: None  Intra-op Plan:   Post-operative Plan: Extubation in OR  Informed  Consent: I have reviewed the patients History and Physical, chart, labs and discussed the procedure including the risks, benefits and alternatives for the proposed anesthesia with the patient or authorized representative who has indicated his/her understanding and acceptance.       Plan Discussed with: Anesthesiologist and CRNA  Anesthesia Plan Comments: (See PAT note 12/27/2021 61 y.o. former smoker with h/o CAD, large umbilical hernia scheduled for above procedure 01/07/2022 with Dr. Luretha Murphy.  Pt last seen by cardiology 10/21/2021. Per OV note, "PREOP:   His risk index is 6.6% for major cardiovascular events.  He and I discussed this.  He has a very high functional level.  He is not having any unstable symptoms or physical findings.  No further cardiovascular testing is indicated and he would be willing to accept the risk." )       Anesthesia Quick Evaluation

## 2022-01-06 NOTE — H&P (Signed)
REFERRING PHYSICIAN: Memory Dance, NP  PROVIDER: Katha Cabal, MD  MRN: [id number] DOB: 08-16-1960   Chief Complaint:  Umbilical Hernia   History of Present Illness: Raymond Bowers is a 61 y.o. male who is seen today as an office consultation at the request of Dr. Dannielle Huh for evaluation of Hernia .  He is seen in [facility] office with an enlarging umbilical hernia with attenuation of the umbilical skin. This has been increasing in size over the last few years and now protrudes quite prominently.  Review of Systems: See HPI as well for other ROS.  ROS  Medical History: Past Medical History: Diagnosis Date CHF (congestive heart failure) (CMS-HCC) MI (myocardial infarction) (CMS-HCC) 2018  Patient Active Problem List Diagnosis Coronary artery disease involving native coronary artery of native heart without angina pectoris Dyslipidemia Hyperlipidemia with target LDL less than 70 Inferior MI (CMS-HCC) Other secondary pulmonary hypertension (CMS-HCC) Tobacco abuse  Past Surgical History: Procedure Laterality Date wrist surgery   No Known Allergies  Current Outpatient Medications on File Prior to Visit Medication Sig Dispense Refill metFORMIN (GLUCOPHAGE) 500 MG tablet TAKE 1 TABLET BY MOUTH ONCE DAILY TO PREVENT DIABETES AND HELP WITH WEIGH LOSS aspirin 81 MG EC tablet Take 81 mg by mouth once daily metoprolol succinate (TOPROL-XL) 25 MG XL tablet Take 25 mg by mouth once daily rosuvastatin (CRESTOR) 40 MG tablet Take 40 mg by mouth once daily  No current facility-administered medications on file prior to visit.  Family History Problem Relation Age of Onset Hyperlipidemia (Elevated cholesterol) Mother Coronary Artery Disease (Blocked arteries around heart) Mother Diabetes Brother   Social History  Tobacco Use Smoking Status Every Day Types: Cigarettes Last attempt to quit: 2018 Years since quitting: 5.7 Smokeless Tobacco Not on  file   Social History  Socioeconomic History Marital status: Married Tobacco Use Smoking status: Every Day Types: Cigarettes Last attempt to quit: 2018 Years since quitting: 5.7 Substance and Sexual Activity Alcohol use: Yes Drug use: Never  Objective:  Vitals: BP: 128/86 Pulse: 69 Temp: 36.8 C (98.2 F) SpO2: 98% Weight: 91.1 kg (200 lb 12.8 oz) Height: 171.5 cm (5' 7.5")  Body mass index is 30.99 kg/m.  Physical Exam General: average build, not obese WM NAD HEENT :unremarkable Chest: clear Heart: SR Breast: not examined Abdomen: prominent golf ball sized with shiny skin overlying GU not examined Rectal not performed Extremities FROM Neuro alert and oriented x 3  Labs, Imaging and Diagnostic Testing: None to review  Assessment and Plan  Umbilical hernia without obstruction and without gangrene    He doesn't hear gurgling in it so it may contain preperitoneal fat. I think that it is amenable to lap assisted repair with mesh. I have explained that to him in detail and I gave him a hernia book. He has not had any other problems with his heart and he is followed by Dr. Antoine Poche. Will get cardiac clearance.   Marinda Tyer Charna Busman, MD

## 2022-01-07 ENCOUNTER — Encounter (HOSPITAL_COMMUNITY): Payer: Self-pay | Admitting: Surgery

## 2022-01-07 ENCOUNTER — Ambulatory Visit (HOSPITAL_COMMUNITY): Payer: Commercial Managed Care - PPO | Admitting: Physician Assistant

## 2022-01-07 ENCOUNTER — Other Ambulatory Visit: Payer: Self-pay

## 2022-01-07 ENCOUNTER — Encounter (HOSPITAL_COMMUNITY)
Admission: RE | Disposition: A | Discharge status: Home / Self Care | Payer: Self-pay | Source: Home / Self Care | Attending: Surgery

## 2022-01-07 ENCOUNTER — Ambulatory Visit (HOSPITAL_BASED_OUTPATIENT_CLINIC_OR_DEPARTMENT_OTHER): Payer: Commercial Managed Care - PPO | Admitting: Anesthesiology

## 2022-01-07 ENCOUNTER — Ambulatory Visit (HOSPITAL_COMMUNITY)
Admission: RE | Admit: 2022-01-07 | Discharge: 2022-01-07 | Disposition: A | Discharge status: Home / Self Care | Payer: Commercial Managed Care - PPO | Attending: Surgery | Admitting: Surgery

## 2022-01-07 ENCOUNTER — Other Ambulatory Visit (HOSPITAL_COMMUNITY): Payer: Self-pay

## 2022-01-07 DIAGNOSIS — I110 Hypertensive heart disease with heart failure: Secondary | ICD-10-CM | POA: Insufficient documentation

## 2022-01-07 DIAGNOSIS — I10 Essential (primary) hypertension: Secondary | ICD-10-CM | POA: Diagnosis not present

## 2022-01-07 DIAGNOSIS — I2729 Other secondary pulmonary hypertension: Secondary | ICD-10-CM

## 2022-01-07 DIAGNOSIS — K420 Umbilical hernia with obstruction, without gangrene: Secondary | ICD-10-CM | POA: Diagnosis present

## 2022-01-07 DIAGNOSIS — I509 Heart failure, unspecified: Secondary | ICD-10-CM | POA: Diagnosis not present

## 2022-01-07 DIAGNOSIS — Z79899 Other long term (current) drug therapy: Secondary | ICD-10-CM | POA: Diagnosis not present

## 2022-01-07 DIAGNOSIS — Z87891 Personal history of nicotine dependence: Secondary | ICD-10-CM

## 2022-01-07 DIAGNOSIS — Z8719 Personal history of other diseases of the digestive system: Secondary | ICD-10-CM

## 2022-01-07 DIAGNOSIS — I252 Old myocardial infarction: Secondary | ICD-10-CM | POA: Insufficient documentation

## 2022-01-07 DIAGNOSIS — I2510 Atherosclerotic heart disease of native coronary artery without angina pectoris: Secondary | ICD-10-CM | POA: Insufficient documentation

## 2022-01-07 HISTORY — PX: UMBILICAL HERNIA REPAIR: SHX196

## 2022-01-07 LAB — GLUCOSE, CAPILLARY: Glucose-Capillary: 123 mg/dL — ABNORMAL HIGH (ref 70–99)

## 2022-01-07 SURGERY — REPAIR, HERNIA, UMBILICAL, LAPAROSCOPIC
Anesthesia: General | Site: Abdomen

## 2022-01-07 MED ORDER — MIDAZOLAM HCL 2 MG/2ML IJ SOLN
INTRAMUSCULAR | Status: AC
  Filled 2022-01-07: qty 2

## 2022-01-07 MED ORDER — FENTANYL CITRATE (PF) 100 MCG/2ML IJ SOLN
INTRAMUSCULAR | Status: AC
  Filled 2022-01-07: qty 2

## 2022-01-07 MED ORDER — CHLORHEXIDINE GLUCONATE CLOTH 2 % EX PADS: 6.0000 | MEDICATED_PAD | Freq: Once | CUTANEOUS | Status: DC

## 2022-01-07 MED ORDER — ONDANSETRON HCL 4 MG/2ML IJ SOLN
INTRAMUSCULAR | Status: DC | PRN
  Administered 2022-01-07: 4 mg via INTRAVENOUS

## 2022-01-07 MED ORDER — CEFAZOLIN SODIUM-DEXTROSE 2-4 GM/100ML-% IV SOLN
2.0000 g | INTRAVENOUS | Status: AC
  Administered 2022-01-07: 2 g via INTRAVENOUS
  Filled 2022-01-07: qty 100

## 2022-01-07 MED ORDER — DEXMEDETOMIDINE HCL IN NACL 80 MCG/20ML IV SOLN
INTRAVENOUS | Status: DC | PRN
  Administered 2022-01-07: 4 ug via BUCCAL
  Administered 2022-01-07: 8 ug via BUCCAL

## 2022-01-07 MED ORDER — PROPOFOL 10 MG/ML IV BOLUS
INTRAVENOUS | Status: DC | PRN
  Administered 2022-01-07: 170 mg via INTRAVENOUS

## 2022-01-07 MED ORDER — OXYCODONE HCL 5 MG PO TABS
ORAL_TABLET | ORAL | Status: AC
  Filled 2022-01-07: qty 1

## 2022-01-07 MED ORDER — BUPIVACAINE-EPINEPHRINE (PF) 0.5% -1:200000 IJ SOLN
INTRAMUSCULAR | Status: AC
  Filled 2022-01-07: qty 30

## 2022-01-07 MED ORDER — DEXAMETHASONE SODIUM PHOSPHATE 10 MG/ML IJ SOLN
INTRAMUSCULAR | Status: AC
  Filled 2022-01-07: qty 2

## 2022-01-07 MED ORDER — SCOPOLAMINE 1 MG/3DAYS TD PT72
1.0000 | MEDICATED_PATCH | TRANSDERMAL | Status: DC
  Administered 2022-01-07: 1.5 mg via TRANSDERMAL
  Filled 2022-01-07: qty 1

## 2022-01-07 MED ORDER — EPHEDRINE 5 MG/ML INJ
INTRAVENOUS | Status: AC
  Filled 2022-01-07: qty 5

## 2022-01-07 MED ORDER — 0.9 % SODIUM CHLORIDE (POUR BTL) OPTIME
TOPICAL | Status: DC | PRN
  Administered 2022-01-07: 1000 mL

## 2022-01-07 MED ORDER — DEXMEDETOMIDINE HCL IN NACL 80 MCG/20ML IV SOLN
INTRAVENOUS | Status: AC
  Filled 2022-01-07: qty 20

## 2022-01-07 MED ORDER — LACTATED RINGERS IR SOLN
Status: DC | PRN
  Administered 2022-01-07: 1000 mL

## 2022-01-07 MED ORDER — ONDANSETRON HCL 4 MG/2ML IJ SOLN: 4.0000 mg | Freq: Once | INTRAMUSCULAR | Status: DC | PRN

## 2022-01-07 MED ORDER — FENTANYL CITRATE PF 50 MCG/ML IJ SOSY
25.0000 ug | PREFILLED_SYRINGE | INTRAMUSCULAR | Status: DC | PRN
  Administered 2022-01-07: 50 ug via INTRAVENOUS

## 2022-01-07 MED ORDER — OXYCODONE HCL 5 MG PO TABS: 5.0000 mg | ORAL_TABLET | Freq: Four times a day (QID) | ORAL | 0 refills | Status: AC | PRN

## 2022-01-07 MED ORDER — ACETAMINOPHEN 325 MG PO TABS: 325.0000 mg | ORAL_TABLET | ORAL | Status: DC | PRN

## 2022-01-07 MED ORDER — BUPIVACAINE LIPOSOME 1.3 % IJ SUSP
INTRAMUSCULAR | Status: AC
  Filled 2022-01-07: qty 20

## 2022-01-07 MED ORDER — PHENYLEPHRINE 80 MCG/ML (10ML) SYRINGE FOR IV PUSH (FOR BLOOD PRESSURE SUPPORT)
PREFILLED_SYRINGE | INTRAVENOUS | Status: AC
  Filled 2022-01-07: qty 10

## 2022-01-07 MED ORDER — ROCURONIUM BROMIDE 10 MG/ML (PF) SYRINGE
PREFILLED_SYRINGE | INTRAVENOUS | Status: DC | PRN
  Administered 2022-01-07: 60 mg via INTRAVENOUS

## 2022-01-07 MED ORDER — ACETAMINOPHEN 160 MG/5ML PO SOLN: 325.0000 mg | ORAL | Status: DC | PRN

## 2022-01-07 MED ORDER — KETOROLAC TROMETHAMINE 30 MG/ML IJ SOLN
INTRAMUSCULAR | Status: DC | PRN
  Administered 2022-01-07: 30 mg via INTRAVENOUS

## 2022-01-07 MED ORDER — LACTATED RINGERS IV SOLN: INTRAVENOUS | Status: DC

## 2022-01-07 MED ORDER — PROPOFOL 10 MG/ML IV BOLUS
INTRAVENOUS | Status: AC
  Filled 2022-01-07: qty 20

## 2022-01-07 MED ORDER — LACTATED RINGERS IV SOLN: INTRAVENOUS | Status: DC | PRN

## 2022-01-07 MED ORDER — ORAL CARE MOUTH RINSE: 15.0000 mL | Freq: Once | OROMUCOSAL | Status: AC

## 2022-01-07 MED ORDER — FENTANYL CITRATE PF 50 MCG/ML IJ SOSY
PREFILLED_SYRINGE | INTRAMUSCULAR | Status: AC
  Filled 2022-01-07: qty 1

## 2022-01-07 MED ORDER — OXYCODONE HCL 5 MG PO TABS
5.0000 mg | ORAL_TABLET | Freq: Four times a day (QID) | ORAL | 0 refills | Status: AC | PRN
  Filled 2022-01-07: qty 20, 5d supply, fill #0

## 2022-01-07 MED ORDER — CHLORHEXIDINE GLUCONATE 0.12 % MT SOLN
15.0000 mL | Freq: Once | OROMUCOSAL | Status: AC
  Administered 2022-01-07: 15 mL via OROMUCOSAL

## 2022-01-07 MED ORDER — MIDAZOLAM HCL 5 MG/5ML IJ SOLN
INTRAMUSCULAR | Status: DC | PRN
  Administered 2022-01-07: 2 mg via INTRAVENOUS

## 2022-01-07 MED ORDER — BUPIVACAINE LIPOSOME 1.3 % IJ SUSP
INTRAMUSCULAR | Status: DC | PRN
  Administered 2022-01-07: 20 mL

## 2022-01-07 MED ORDER — MEPERIDINE HCL 50 MG/ML IJ SOLN: 6.2500 mg | INTRAMUSCULAR | Status: DC | PRN

## 2022-01-07 MED ORDER — LIDOCAINE HCL (PF) 2 % IJ SOLN
INTRAMUSCULAR | Status: AC
  Filled 2022-01-07: qty 10

## 2022-01-07 MED ORDER — ROCURONIUM BROMIDE 10 MG/ML (PF) SYRINGE
PREFILLED_SYRINGE | INTRAVENOUS | Status: AC
  Filled 2022-01-07: qty 10

## 2022-01-07 MED ORDER — ACETAMINOPHEN 500 MG PO TABS
1000.0000 mg | ORAL_TABLET | ORAL | Status: AC
  Administered 2022-01-07: 1000 mg via ORAL
  Filled 2022-01-07: qty 2

## 2022-01-07 MED ORDER — BUPIVACAINE LIPOSOME 1.3 % IJ SUSP: 20.0000 mL | Freq: Once | INTRAMUSCULAR | Status: DC

## 2022-01-07 MED ORDER — FENTANYL CITRATE (PF) 100 MCG/2ML IJ SOLN
INTRAMUSCULAR | Status: DC | PRN
  Administered 2022-01-07: 100 ug via INTRAVENOUS

## 2022-01-07 MED ORDER — SUGAMMADEX SODIUM 200 MG/2ML IV SOLN
INTRAVENOUS | Status: DC | PRN
  Administered 2022-01-07: 200 mg via INTRAVENOUS

## 2022-01-07 MED ORDER — LIDOCAINE 2% (20 MG/ML) 5 ML SYRINGE
INTRAMUSCULAR | Status: DC | PRN
  Administered 2022-01-07: 80 mg via INTRAVENOUS

## 2022-01-07 MED ORDER — OXYCODONE HCL 5 MG PO TABS
5.0000 mg | ORAL_TABLET | Freq: Once | ORAL | Status: AC | PRN
  Administered 2022-01-07: 5 mg via ORAL

## 2022-01-07 MED ORDER — OXYCODONE HCL 5 MG/5ML PO SOLN: 5.0000 mg | Freq: Once | ORAL | Status: AC | PRN

## 2022-01-07 MED ORDER — ONDANSETRON HCL 4 MG/2ML IJ SOLN
INTRAMUSCULAR | Status: AC
  Filled 2022-01-07: qty 4

## 2022-01-07 MED ORDER — PHENYLEPHRINE 80 MCG/ML (10ML) SYRINGE FOR IV PUSH (FOR BLOOD PRESSURE SUPPORT)
PREFILLED_SYRINGE | INTRAVENOUS | Status: DC | PRN
  Administered 2022-01-07 (×2): 160 ug via INTRAVENOUS

## 2022-01-07 MED ORDER — DEXAMETHASONE SODIUM PHOSPHATE 10 MG/ML IJ SOLN
INTRAMUSCULAR | Status: DC | PRN
  Administered 2022-01-07: 10 mg via INTRAVENOUS

## 2022-01-07 SURGICAL SUPPLY — 43 items
BAG COUNTER SPONGE SURGICOUNT (BAG) IMPLANT
BINDER ABDOMINAL 12 ML 46-62 (SOFTGOODS) IMPLANT
COVER SURGICAL LIGHT HANDLE (MISCELLANEOUS) ×1 IMPLANT
DERMABOND ADVANCED .7 DNX12 (GAUZE/BANDAGES/DRESSINGS) IMPLANT
DEVICE SECURE STRAP 25 ABSORB (INSTRUMENTS) IMPLANT
DEVICE TROCAR PUNCTURE CLOSURE (ENDOMECHANICALS) IMPLANT
DISSECTOR BLUNT TIP ENDO 5MM (MISCELLANEOUS) IMPLANT
DRAIN CHANNEL 19F RND (DRAIN) IMPLANT
ELECT REM PT RETURN 15FT ADLT (MISCELLANEOUS) ×1 IMPLANT
EVACUATOR SILICONE 100CC (DRAIN) IMPLANT
GAUZE PAD ABD 7.5X8 STRL (GAUZE/BANDAGES/DRESSINGS) IMPLANT
GLOVE SURG LX STRL 8.0 MICRO (GLOVE) ×1 IMPLANT
GOWN SPEC L4 XLG W/TWL (GOWN DISPOSABLE) ×1 IMPLANT
GOWN STRL REUS W/ TWL XL LVL3 (GOWN DISPOSABLE) ×2 IMPLANT
GOWN STRL REUS W/TWL XL LVL3 (GOWN DISPOSABLE) ×2
IRRIG SUCT STRYKERFLOW 2 WTIP (MISCELLANEOUS)
IRRIGATION SUCT STRKRFLW 2 WTP (MISCELLANEOUS) IMPLANT
KIT BASIN OR (CUSTOM PROCEDURE TRAY) ×1 IMPLANT
KIT TURNOVER KIT A (KITS) IMPLANT
MARKER SKIN DUAL TIP RULER LAB (MISCELLANEOUS) IMPLANT
MESH VENTRALEX ST 2.5 CRC MED (Mesh General) IMPLANT
NDL SPNL 22GX3.5 QUINCKE BK (NEEDLE) IMPLANT
NEEDLE SPNL 22GX3.5 QUINCKE BK (NEEDLE) IMPLANT
PAD POSITIONING PINK XL (MISCELLANEOUS) IMPLANT
PENCIL SMOKE EVACUATOR (MISCELLANEOUS) IMPLANT
PROTECTOR NERVE ULNAR (MISCELLANEOUS) IMPLANT
SCISSORS LAP 5X45 EPIX DISP (ENDOMECHANICALS) ×1 IMPLANT
SET TUBE SMOKE EVAC HIGH FLOW (TUBING) ×1 IMPLANT
SLEEVE Z-THREAD 5X100MM (TROCAR) ×1 IMPLANT
SPIKE FLUID TRANSFER (MISCELLANEOUS) ×1 IMPLANT
STAPLER VISISTAT 35W (STAPLE) IMPLANT
STRIP CLOSURE SKIN 1/2X4 (GAUZE/BANDAGES/DRESSINGS) IMPLANT
SUT NOVA 0 T19/GS 22DT (SUTURE) IMPLANT
SUT NOVA NAB DX-16 0-1 5-0 T12 (SUTURE) ×1 IMPLANT
SUT NOVA NAB GS-21 0 18 T12 DT (SUTURE) IMPLANT
SUT VIC AB 4-0 SH 18 (SUTURE) ×1 IMPLANT
TACKER 5MM HERNIA 3.5CML NAB (ENDOMECHANICALS) IMPLANT
TOWEL OR 17X26 10 PK STRL BLUE (TOWEL DISPOSABLE) ×1 IMPLANT
TOWEL OR NON WOVEN STRL DISP B (DISPOSABLE) ×1 IMPLANT
TRAY FOLEY MTR SLVR 16FR STAT (SET/KITS/TRAYS/PACK) IMPLANT
TRAY LAPAROSCOPIC (CUSTOM PROCEDURE TRAY) ×1 IMPLANT
TROCAR 11X100 Z THREAD (TROCAR) IMPLANT
TROCAR Z-THREAD OPTICAL 5X100M (TROCAR) ×1 IMPLANT

## 2022-01-07 NOTE — Progress Notes (Signed)
Patient AVS done at 1050 charted AVS documentation at 1225.

## 2022-01-07 NOTE — Transfer of Care (Signed)
Immediate Anesthesia Transfer of Care Note  Patient: Raymond Bowers  Procedure(s) Performed: Procedure(s): LAPAROSCOPIC ASSISTED UMBILICAL HERNIA REPAIRED WITH MESH (N/A)  Patient Location: PACU  Anesthesia Type:General  Level of Consciousness: Alert, Awake, Oriented  Airway & Oxygen Therapy: Patient Spontanous Breathing  Post-op Assessment: Report given to RN  Post vital signs: Reviewed and stable  Last Vitals:  Vitals:   01/07/22 0555  BP: 123/83  Pulse: 70  Resp: 19  Temp: 36.9 C  SpO2: 96%    Complications: No apparent anesthesia complications

## 2022-01-07 NOTE — Anesthesia Postprocedure Evaluation (Signed)
Anesthesia Post Note  Patient: Raymond Bowers  Procedure(s) Performed: LAPAROSCOPIC ASSISTED UMBILICAL HERNIA REPAIRED WITH MESH (Abdomen)     Anesthesia Type: General Anesthetic complications: no   No notable events documented.  Last Vitals:  Vitals:   01/07/22 1020 01/07/22 1030  BP: 123/79 111/79  Pulse: (!) 55 (!) 52  Resp: 20 18  Temp: 36.6 C   SpO2: 98% 97%    Last Pain:  Vitals:   01/07/22 1030  TempSrc:   PainSc: 0-No pain                 Meldrick Buttery

## 2022-01-07 NOTE — Op Note (Signed)
Raymond Bowers  12-Apr-1960   01/07/2022    PCP:  Kaleen Mask, MD   Surgeon: Wenda Low, MD, FACS  Asst:  none  Anes:  general  Preop Dx: Incarcerated umbilical hernia Postop Dx: Same containing omentum  Procedure: Lap assisted umbilical hernia repair Location Surgery: [facility] Complications: None noted  EBL:   minimal cc  Drains: none  Description of Procedure:  The patient was taken to OR 4 .  After anesthesia was administered and the patient was prepped  with ChloraPrep and draped sterilely and a timeout was performed.  Access to the abdomen was achieved with a 5 mm Optiview the left upper quadrant.  Following insufflation the hernia which had been pushing out attenuating the skin of the umbilicus the omentum fell out creating the the very visible Audi.  We observe this looked around with the liver look fine the omentum and small bowel looked fine.  We have made an infraumbilical incision and freed up about a tennis ball size peritoneal defect of sac.  The actual fascial defect was probably about 2 cm or less in diameter.  Once freed the sac was excised at the fascial level.  A piece of medium size Ventralex about 2.56 inches in diameter was inserted and sutured to the perimeter with with 8 sutures of Novafil suturing the mesh to the edge of the fascia.  Next a second 5 mm placed in the left lower quadrant and using that the secure strap was used to secure the perimeter of the mesh flat with the abdominal wall.  Exparel was then injected around the umbilical area and also in the two 5 mm ports.  The skin of the umbilicus was tacked to the fascia making this in any.  The medium layer was closed with 4-0 Vicryl and then the skin was approximated with interrupted 4-0 Monocryl and Dermabond.  Cottonball was placed into the umbilicus to keep it in that position.  An abdominal binder was placed.  The patient tolerated the procedure well and was taken to the PACU in stable condition.      [name], MD, [facility], [place] [phone]

## 2022-01-07 NOTE — Interval H&P Note (Signed)
History and Physical Interval Note:  01/07/2022 7:26 AM  Raymond Bowers  has presented today for surgery, with the diagnosis of LARGE UMBILICAL HERNIA.  The various methods of treatment have been discussed with the patient and family. After consideration of risks, benefits and other options for treatment, the patient has consented to  Procedure(s): LAPAROSCOPIC ASSISTED UMBILICAL HERNIA REPAIRED WITH MESH (N/A) as a surgical intervention.  The patient's history has been reviewed, patient examined, no change in status, stable for surgery.  I have reviewed the patient's chart and labs.  Questions were answered to the patient's satisfaction.     [name]

## 2022-01-07 NOTE — Anesthesia Procedure Notes (Signed)
Procedure Name: Intubation Date/Time: 01/07/2022 7:47 AM  Performed by: Gerald Leitz, CRNAPre-anesthesia Checklist: Patient identified, Patient being monitored, Timeout performed, Emergency Drugs available and Suction available Patient Re-evaluated:Patient Re-evaluated prior to induction Oxygen Delivery Method: Circle system utilized Preoxygenation: Pre-oxygenation with 100% oxygen Induction Type: IV induction Ventilation: Mask ventilation without difficulty Laryngoscope Size: Mac and 3 Grade View: Grade I Tube type: Oral Tube size: 7.5 mm Number of attempts: 1 Airway Equipment and Method: Stylet Placement Confirmation: ETT inserted through vocal cords under direct vision, positive ETCO2 and breath sounds checked- equal and bilateral Secured at: 21 cm Tube secured with: Tape Dental Injury: Teeth and Oropharynx as per pre-operative assessment

## 2022-01-08 ENCOUNTER — Other Ambulatory Visit (HOSPITAL_COMMUNITY): Payer: Self-pay

## 2022-01-11 ENCOUNTER — Encounter (HOSPITAL_COMMUNITY): Payer: Self-pay | Admitting: Surgery

## 2022-02-08 ENCOUNTER — Other Ambulatory Visit: Payer: Self-pay | Admitting: Cardiology

## 2022-02-08 DIAGNOSIS — E785 Hyperlipidemia, unspecified: Secondary | ICD-10-CM

## 2022-08-30 LAB — LAB REPORT - SCANNED
A1c: 5.7
EGFR: 98

## 2022-08-31 ENCOUNTER — Other Ambulatory Visit: Payer: Self-pay | Admitting: Nurse Practitioner

## 2022-08-31 DIAGNOSIS — Z720 Tobacco use: Secondary | ICD-10-CM

## 2022-09-22 ENCOUNTER — Ambulatory Visit
Admission: RE | Admit: 2022-09-22 | Discharge: 2022-09-22 | Disposition: A | Discharge status: Home / Self Care | Payer: Commercial Managed Care - PPO | Source: Ambulatory Visit | Attending: Nurse Practitioner | Admitting: Nurse Practitioner

## 2022-09-22 DIAGNOSIS — Z720 Tobacco use: Secondary | ICD-10-CM

## 2022-10-25 ENCOUNTER — Other Ambulatory Visit: Payer: Self-pay | Admitting: Cardiology

## 2022-11-03 NOTE — Progress Notes (Signed)
  Cardiology Office Note:   Date:  11/04/2022  ID:  Adolph Salami, DOB 07/23/1960, MRN [id number] PCP: Kaleen Mask, MD  [facility] Providers Cardiologist:  Rollene Rotunda, MD {  History of Present Illness:   Raymond Bowers is a 62 y.o. male who presents for follow up after cardiac cath and late presentation for an inferior MI.   Since I last saw him he has had hernia repair.  He is doing well.  The patient denies any new symptoms such as chest discomfort, neck or arm discomfort. There has been no new shortness of breath, PND or orthopnea. There have been no reported palpitations, presyncope or syncope.   ROS: As stated in the HPI and negative for all other systems.  Studies Reviewed:    EKG:   EKG Interpretation Date/Time:  Friday November 04 2022 10:11:34 EDT Ventricular Rate:  69 PR Interval:  116 QRS Duration:  90 QT Interval:  392 QTC Calculation: 420 R Axis:   -14  Text Interpretation: Normal sinus rhythm When compared with ECG of 07-Oct-2016 04:18, Nonspecific T wave abnormality has replaced inverted T waves in Inferior leads Confirmed by Rollene Rotunda ([id number]) on 11/04/2022 10:50:39 AM    Risk Assessment/Calculations:              Physical Exam:   VS:  BP 120/76 (BP Location: Left Arm, Patient Position: Sitting, Cuff Size: Normal)   Pulse 70   Ht 5\' 8"  (1.727 m)   Wt 200 lb 9.6 oz (91 kg)   SpO2 97%   BMI 30.50 kg/m    Wt Readings from Last 3 Encounters:  11/04/22 200 lb 9.6 oz (91 kg)  01/07/22 213 lb (96.6 kg)  12/27/21 213 lb (96.6 kg)     GEN: Well nourished, well developed in no acute distress NECK: No JVD; No carotid bruits CARDIAC: RRR, no murmurs, rubs, gallops RESPIRATORY:  Clear to auscultation without rales, wheezing or rhonchi  ABDOMEN: Soft, non-tender, non-distended EXTREMITIES:  No edema; No deformity   ASSESSMENT AND PLAN:   CAD:  The patient has no new sypmtoms.  No further cardiovascular testing is indicated.  We will  continue with aggressive risk reduction and meds as listed.   DYSLIPIDEMIA:    His LDL was  65 but this was not the most recent. .  I would like this to be in the 50s.  I will get records from the PCP as he has had labs recently.        Follow up with me in one year.   Signed, Rollene Rotunda, MD

## 2022-11-04 ENCOUNTER — Telehealth: Payer: Self-pay | Admitting: *Deleted

## 2022-11-04 ENCOUNTER — Ambulatory Visit: Payer: Commercial Managed Care - PPO | Attending: Cardiology | Admitting: Cardiology

## 2022-11-04 ENCOUNTER — Encounter: Payer: Self-pay | Admitting: Cardiology

## 2022-11-04 VITALS — BP 120/76 | HR 70 | Ht 68.0 in | Wt 200.6 lb

## 2022-11-04 DIAGNOSIS — E785 Hyperlipidemia, unspecified: Secondary | ICD-10-CM

## 2022-11-04 DIAGNOSIS — I2510 Atherosclerotic heart disease of native coronary artery without angina pectoris: Secondary | ICD-10-CM | POA: Diagnosis not present

## 2022-11-04 MED ORDER — EZETIMIBE 10 MG PO TABS
10.0000 mg | ORAL_TABLET | Freq: Every day | ORAL | 3 refills | Status: DC
Start: 2022-11-04 — End: 2023-10-24

## 2022-11-04 NOTE — Patient Instructions (Signed)

## 2022-11-04 NOTE — Telephone Encounter (Signed)
Spoke with pt, we received his lipids from his pcp and his ldl is 78. Per dr Antoine Poche, New script sent to the pharmacy for zetia 10 mg once daily. Lab orders mailed to the pt

## 2022-11-21 ENCOUNTER — Other Ambulatory Visit: Payer: Self-pay | Admitting: Cardiology

## 2023-01-17 ENCOUNTER — Other Ambulatory Visit: Payer: Self-pay | Admitting: Cardiology

## 2023-01-17 DIAGNOSIS — E785 Hyperlipidemia, unspecified: Secondary | ICD-10-CM

## 2023-09-06 ENCOUNTER — Other Ambulatory Visit: Payer: Self-pay | Admitting: Nurse Practitioner

## 2023-09-06 DIAGNOSIS — F17211 Nicotine dependence, cigarettes, in remission: Secondary | ICD-10-CM

## 2023-09-29 ENCOUNTER — Ambulatory Visit
Admission: RE | Admit: 2023-09-29 | Discharge: 2023-09-29 | Disposition: A | Discharge status: Home / Self Care | Source: Ambulatory Visit | Attending: Nurse Practitioner | Admitting: Nurse Practitioner

## 2023-09-29 DIAGNOSIS — F17211 Nicotine dependence, cigarettes, in remission: Secondary | ICD-10-CM

## 2023-10-21 ENCOUNTER — Other Ambulatory Visit: Payer: Self-pay | Admitting: Cardiology

## 2023-10-21 DIAGNOSIS — E785 Hyperlipidemia, unspecified: Secondary | ICD-10-CM

## 2023-11-05 NOTE — Progress Notes (Unsigned)
  Cardiology Office Note:   Date:  11/06/2023  ID:  Raymond Bowers, DOB Aug 23, 1960, MRN [id number] PCP: Loring Tanda Mae, MD  [facility] Providers Cardiologist:  Lynwood Schilling, MD {  History of Present Illness:   Raymond Bowers is a 63 y.o. male  who presents for follow up after cardiac cath and late presentation for an inferior MI.   He had a left heart cath in 2018 with CTO of the RCA and 70% mid LAD stenosis.  Since I last saw him he has done well. The patient denies any new symptoms such as chest discomfort, neck or arm discomfort. There has been no new shortness of breath, PND or orthopnea. There have been no reported palpitations, presyncope or syncope.   He still works.  He does a fair amount of walking at work.  He also does a lot of yard work with pushing a mower leaf blowing and weed eating. The patient denies any new symptoms such as chest discomfort, neck or arm discomfort. There has been no new shortness of breath, PND or orthopnea. There have been no reported palpitations, presyncope or syncope.   ROS: As stated in the HPI and negative for all other systems.  Studies Reviewed:    EKG:   EKG Interpretation Date/Time:  Monday November 06 2023 09:55:31 EDT Ventricular Rate:  78 PR Interval:  132 QRS Duration:  92 QT Interval:  398 QTC Calculation: 453 R Axis:   -12  Text Interpretation: Sinus rhythm with occasional and consecutive Premature ventricular complexes Inferior infarct (cited on or before 06-Nov-2023) When compared with ECG of 04-Nov-2022 10:11, Premature ventricular complexes are now Present Confirmed by Schilling Lynwood ([id number]) on 11/06/2023 10:13:20 AM    Risk Assessment/Calculations:              Physical Exam:   VS:  BP (!) 112/54 (BP Location: Left Arm, Patient Position: Sitting, Cuff Size: Large)   Pulse 78   Ht 5' 8 (1.727 m)   Wt 205 lb 9.6 oz (93.3 kg)   SpO2 97%   BMI 31.26 kg/m    Wt Readings from Last 3 Encounters:  11/06/23 205 lb 9.6  oz (93.3 kg)  11/04/22 200 lb 9.6 oz (91 kg)  01/07/22 213 lb (96.6 kg)     GEN: Well nourished, well developed in no acute distress NECK: No JVD; No carotid bruits CARDIAC: RRR, no murmurs, rubs, gallops RESPIRATORY:  Clear to auscultation without rales, wheezing or rhonchi  ABDOMEN: Soft, non-tender, non-distended EXTREMITIES:  No edema; No deformity   ASSESSMENT AND PLAN:   CAD:  The patient has no new sypmtoms.  No further cardiovascular testing is indicated.  We will continue with aggressive risk reduction and meds as listed.  DYSLIPIDEMIA:    His LDL was 45 with an HDL of 41.  He has had previously an LP(a) of 44.  No change in therapy.    Follow up with me in 1 year or sooner if needed.  Signed, Lynwood Schilling, MD

## 2023-11-06 ENCOUNTER — Ambulatory Visit: Attending: Cardiology | Admitting: Cardiology

## 2023-11-06 ENCOUNTER — Encounter: Payer: Self-pay | Admitting: Cardiology

## 2023-11-06 VITALS — BP 112/54 | HR 78 | Ht 68.0 in | Wt 205.6 lb

## 2023-11-06 DIAGNOSIS — E785 Hyperlipidemia, unspecified: Secondary | ICD-10-CM

## 2023-11-06 DIAGNOSIS — I2510 Atherosclerotic heart disease of native coronary artery without angina pectoris: Secondary | ICD-10-CM | POA: Diagnosis not present

## 2023-11-06 MED ORDER — NITROGLYCERIN 0.4 MG SL SUBL: 0.4000 mg | SUBLINGUAL_TABLET | SUBLINGUAL | 10 refills | Status: AC | PRN

## 2023-11-06 NOTE — Patient Instructions (Signed)

## 2023-11-07 ENCOUNTER — Other Ambulatory Visit: Payer: Self-pay | Admitting: Cardiology

## 2024-01-02 ENCOUNTER — Other Ambulatory Visit: Payer: Self-pay | Admitting: Cardiology

## 2024-01-02 DIAGNOSIS — E785 Hyperlipidemia, unspecified: Secondary | ICD-10-CM

## 2024-01-28 ENCOUNTER — Other Ambulatory Visit: Payer: Self-pay | Admitting: Cardiology

## 2024-01-28 DIAGNOSIS — E785 Hyperlipidemia, unspecified: Secondary | ICD-10-CM

## 2024-01-31 NOTE — Telephone Encounter (Signed)
 Lipid and CMP done on 09/01/23 [facility]
